# Patient Record
Sex: Male | Born: 1940 | Race: White | Hispanic: No | Marital: Single | Smoking: Former smoker
Health system: Southern US, Community
[De-identification: ages and names within clinical notes are randomized; demographics above are authoritative.]

## PROBLEM LIST (undated history)

## (undated) DIAGNOSIS — M1990 Unspecified osteoarthritis, unspecified site: Secondary | ICD-10-CM

## (undated) DIAGNOSIS — K219 Gastro-esophageal reflux disease without esophagitis: Secondary | ICD-10-CM

## (undated) DIAGNOSIS — Z8489 Family history of other specified conditions: Secondary | ICD-10-CM

## (undated) DIAGNOSIS — C801 Malignant (primary) neoplasm, unspecified: Secondary | ICD-10-CM

## (undated) DIAGNOSIS — I10 Essential (primary) hypertension: Secondary | ICD-10-CM

## (undated) HISTORY — PX: HERNIA REPAIR: SHX51

## (undated) HISTORY — PX: BACK SURGERY: SHX140

## (undated) HISTORY — PX: TONSILLECTOMY: SUR1361

## (undated) HISTORY — PX: PROSTATE SURGERY: SHX751

---

## 2005-01-18 ENCOUNTER — Ambulatory Visit: Payer: Self-pay | Admitting: Family Medicine

## 2005-03-10 ENCOUNTER — Ambulatory Visit: Payer: Self-pay | Admitting: Family Medicine

## 2010-03-03 ENCOUNTER — Ambulatory Visit
Admission: RE | Admit: 2010-03-03 | Discharge: 2010-03-17 | Payer: Self-pay | Source: Home / Self Care | Attending: Radiation Oncology | Admitting: Radiation Oncology

## 2010-03-30 ENCOUNTER — Ambulatory Visit
Admission: RE | Admit: 2010-03-30 | Discharge: 2010-04-20 | Payer: Self-pay | Source: Home / Self Care | Attending: Radiation Oncology | Admitting: Radiation Oncology

## 2010-04-06 ENCOUNTER — Encounter
Admission: RE | Admit: 2010-04-06 | Discharge: 2010-04-06 | Payer: Self-pay | Source: Home / Self Care | Attending: Urology | Admitting: Urology

## 2010-04-21 ENCOUNTER — Ambulatory Visit: Payer: Self-pay | Admitting: Radiation Oncology

## 2010-05-12 ENCOUNTER — Ambulatory Visit (HOSPITAL_COMMUNITY): Payer: Medicare Other | Attending: Urology

## 2010-05-12 ENCOUNTER — Ambulatory Visit (HOSPITAL_BASED_OUTPATIENT_CLINIC_OR_DEPARTMENT_OTHER)
Admission: RE | Admit: 2010-05-12 | Discharge: 2010-05-12 | Disposition: A | Discharge status: Home / Self Care | Payer: Medicare Other | Attending: Urology | Admitting: Urology

## 2010-05-12 DIAGNOSIS — Z79899 Other long term (current) drug therapy: Secondary | ICD-10-CM | POA: Insufficient documentation

## 2010-05-12 DIAGNOSIS — C61 Malignant neoplasm of prostate: Secondary | ICD-10-CM | POA: Insufficient documentation

## 2010-05-12 DIAGNOSIS — Z7982 Long term (current) use of aspirin: Secondary | ICD-10-CM | POA: Insufficient documentation

## 2010-05-12 DIAGNOSIS — Z87442 Personal history of urinary calculi: Secondary | ICD-10-CM | POA: Insufficient documentation

## 2010-05-12 DIAGNOSIS — K219 Gastro-esophageal reflux disease without esophagitis: Secondary | ICD-10-CM | POA: Insufficient documentation

## 2010-05-12 DIAGNOSIS — I10 Essential (primary) hypertension: Secondary | ICD-10-CM | POA: Insufficient documentation

## 2010-05-12 DIAGNOSIS — Z01810 Encounter for preprocedural cardiovascular examination: Secondary | ICD-10-CM | POA: Insufficient documentation

## 2010-05-27 ENCOUNTER — Ambulatory Visit: Payer: Medicare Other | Attending: Radiation Oncology | Admitting: Radiation Oncology

## 2010-05-27 DIAGNOSIS — R310 Gross hematuria: Secondary | ICD-10-CM | POA: Insufficient documentation

## 2010-05-27 DIAGNOSIS — Y842 Radiological procedure and radiotherapy as the cause of abnormal reaction of the patient, or of later complication, without mention of misadventure at the time of the procedure: Secondary | ICD-10-CM | POA: Insufficient documentation

## 2010-05-27 DIAGNOSIS — C61 Malignant neoplasm of prostate: Secondary | ICD-10-CM | POA: Insufficient documentation

## 2010-05-27 DIAGNOSIS — R300 Dysuria: Secondary | ICD-10-CM | POA: Insufficient documentation

## 2010-05-27 DIAGNOSIS — N342 Other urethritis: Secondary | ICD-10-CM | POA: Insufficient documentation

## 2010-09-14 ENCOUNTER — Ambulatory Visit
Admission: RE | Admit: 2010-09-14 | Discharge: 2010-09-14 | Disposition: A | Discharge status: Home / Self Care | Payer: Medicare Other | Source: Ambulatory Visit | Attending: Radiation Oncology | Admitting: Radiation Oncology

## 2012-01-20 IMAGING — CR DG CHEST 2V
2 series · 2 of 2 positions shown · non-contrast
Comparison: None.

CLINICAL DATA: Preop.  Prostate cancer.

CHEST - 2 VIEW

[w chest pa]
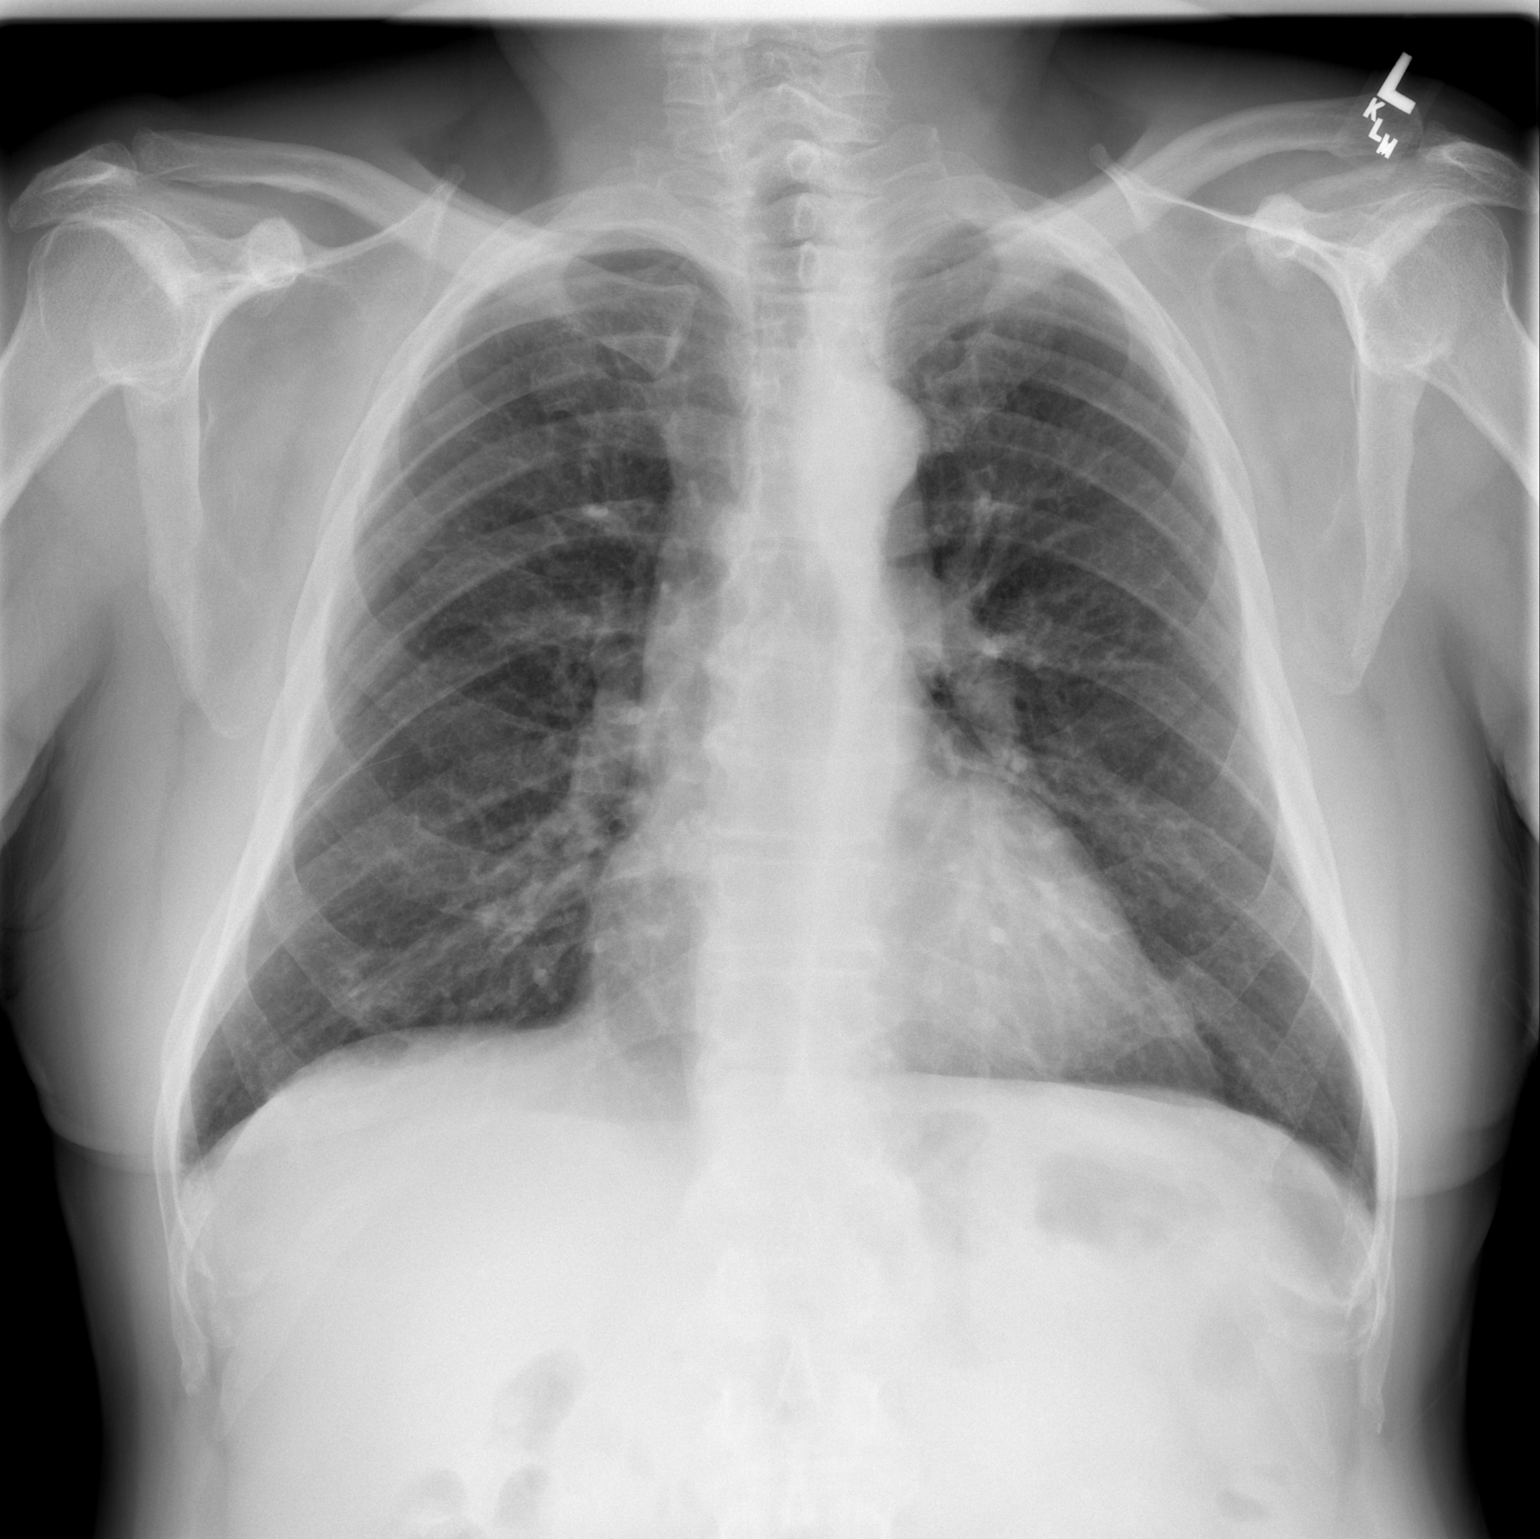

[w chest lat]
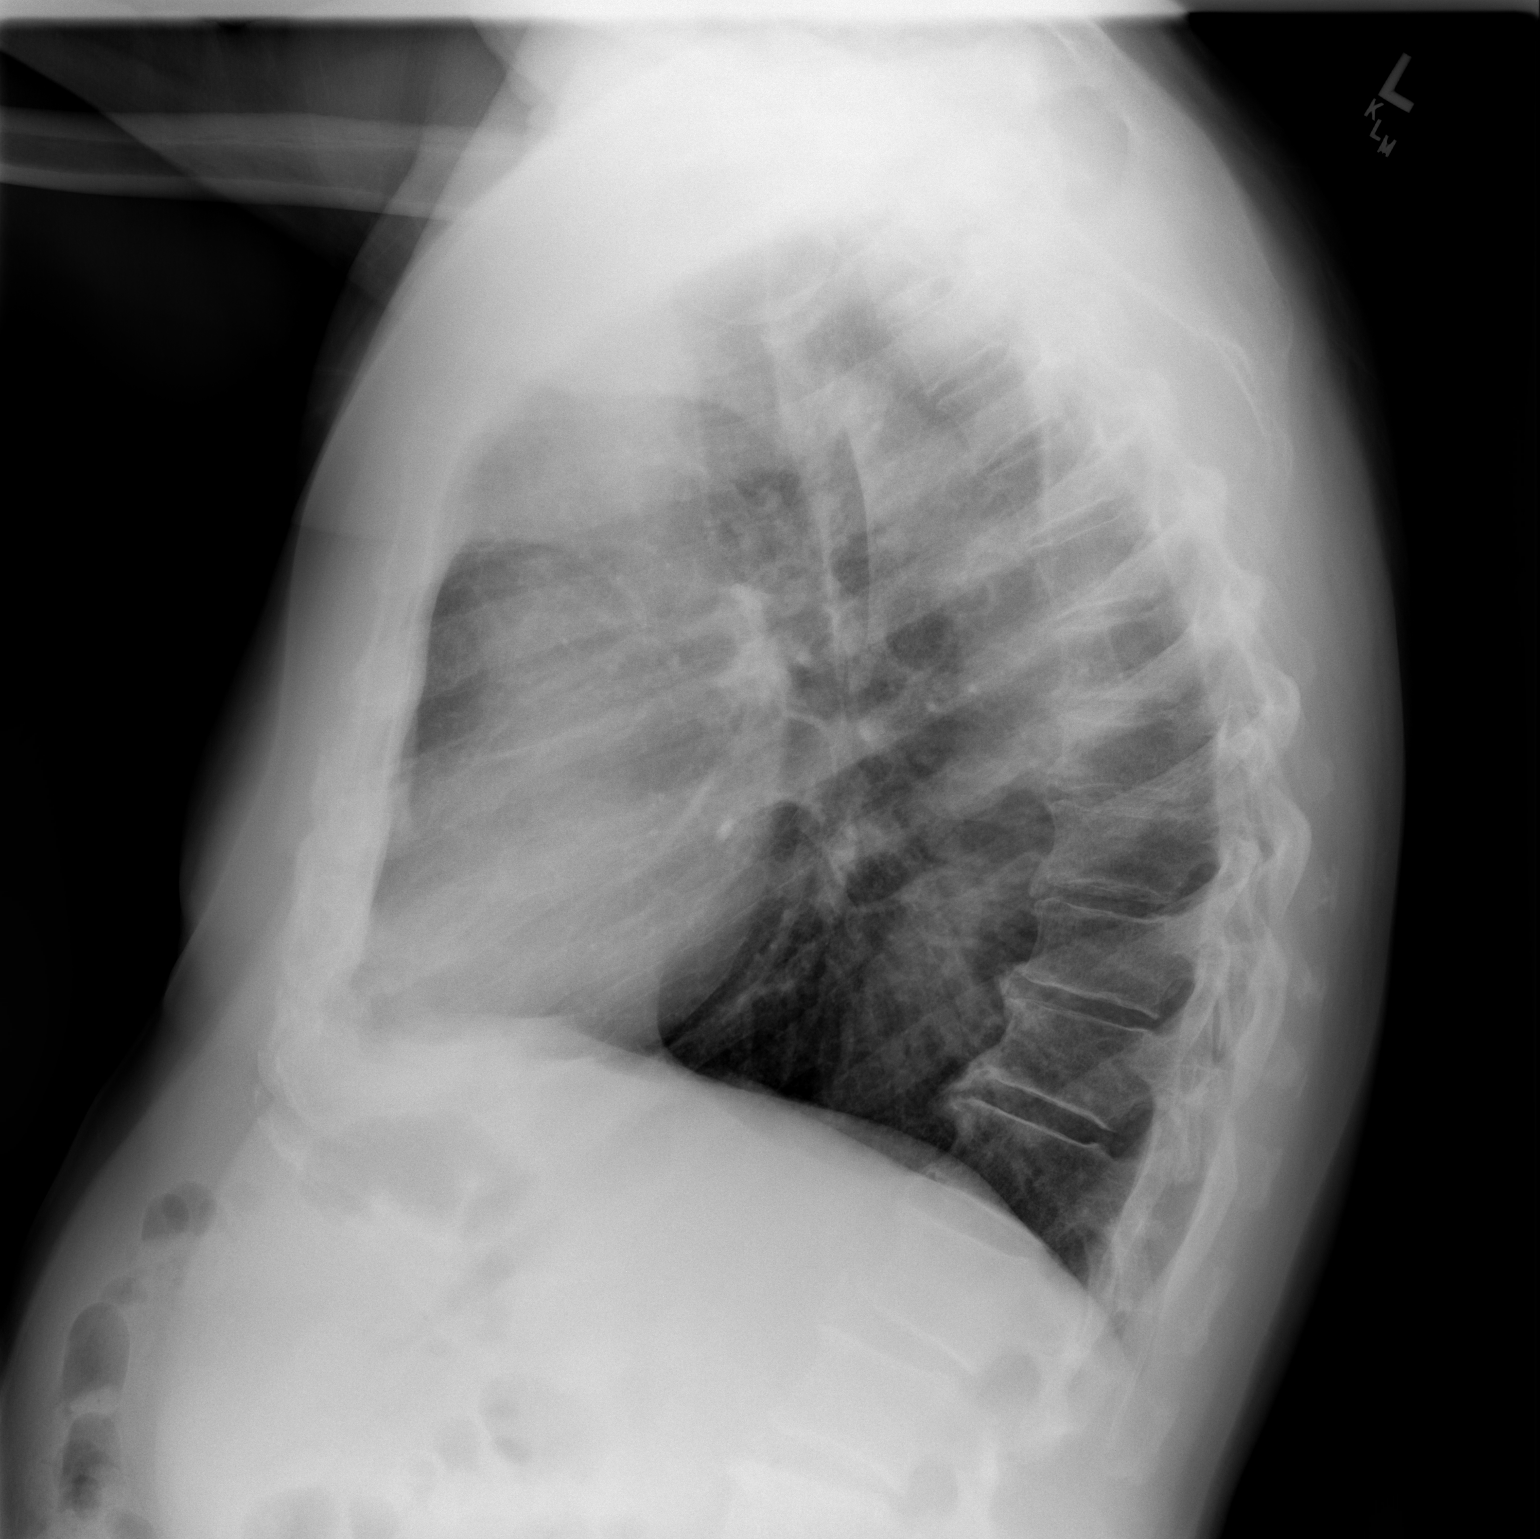

[2 of 2 positions shown; findings below may reference images not displayed]

FINDINGS: Normal heart size.  Clear lungs.  Increased AP diameter
of the chest is likely related to COPD.  Bronchitic changes have a
chronic appearance.  No pneumothorax.  No pleural effusion.
IMPRESSION: No active cardiopulmonary disease.

## 2015-05-06 DIAGNOSIS — H6982 Other specified disorders of Eustachian tube, left ear: Secondary | ICD-10-CM | POA: Insufficient documentation

## 2015-05-06 DIAGNOSIS — H9012 Conductive hearing loss, unilateral, left ear, with unrestricted hearing on the contralateral side: Secondary | ICD-10-CM | POA: Insufficient documentation

## 2016-08-30 DIAGNOSIS — I10 Essential (primary) hypertension: Secondary | ICD-10-CM | POA: Insufficient documentation

## 2016-08-30 DIAGNOSIS — Z01810 Encounter for preprocedural cardiovascular examination: Secondary | ICD-10-CM | POA: Insufficient documentation

## 2016-08-30 DIAGNOSIS — Z0000 Encounter for general adult medical examination without abnormal findings: Secondary | ICD-10-CM | POA: Insufficient documentation

## 2016-12-13 DIAGNOSIS — M0579 Rheumatoid arthritis with rheumatoid factor of multiple sites without organ or systems involvement: Secondary | ICD-10-CM | POA: Insufficient documentation

## 2016-12-13 DIAGNOSIS — C61 Malignant neoplasm of prostate: Secondary | ICD-10-CM | POA: Insufficient documentation

## 2017-04-11 DIAGNOSIS — H2513 Age-related nuclear cataract, bilateral: Secondary | ICD-10-CM | POA: Diagnosis not present

## 2017-04-11 DIAGNOSIS — H52223 Regular astigmatism, bilateral: Secondary | ICD-10-CM | POA: Diagnosis not present

## 2017-04-11 DIAGNOSIS — Z79899 Other long term (current) drug therapy: Secondary | ICD-10-CM | POA: Diagnosis not present

## 2017-04-28 DIAGNOSIS — M0579 Rheumatoid arthritis with rheumatoid factor of multiple sites without organ or systems involvement: Secondary | ICD-10-CM | POA: Diagnosis not present

## 2017-07-11 DIAGNOSIS — L578 Other skin changes due to chronic exposure to nonionizing radiation: Secondary | ICD-10-CM | POA: Diagnosis not present

## 2017-07-11 DIAGNOSIS — L570 Actinic keratosis: Secondary | ICD-10-CM | POA: Diagnosis not present

## 2017-07-11 DIAGNOSIS — R233 Spontaneous ecchymoses: Secondary | ICD-10-CM | POA: Diagnosis not present

## 2017-07-11 DIAGNOSIS — L821 Other seborrheic keratosis: Secondary | ICD-10-CM | POA: Diagnosis not present

## 2017-07-25 DIAGNOSIS — E669 Obesity, unspecified: Secondary | ICD-10-CM | POA: Diagnosis not present

## 2017-07-25 DIAGNOSIS — M150 Primary generalized (osteo)arthritis: Secondary | ICD-10-CM | POA: Diagnosis not present

## 2017-07-25 DIAGNOSIS — M0579 Rheumatoid arthritis with rheumatoid factor of multiple sites without organ or systems involvement: Secondary | ICD-10-CM | POA: Diagnosis not present

## 2017-07-25 DIAGNOSIS — Z79899 Other long term (current) drug therapy: Secondary | ICD-10-CM | POA: Diagnosis not present

## 2017-07-25 DIAGNOSIS — M5410 Radiculopathy, site unspecified: Secondary | ICD-10-CM | POA: Diagnosis not present

## 2017-07-25 DIAGNOSIS — K210 Gastro-esophageal reflux disease with esophagitis: Secondary | ICD-10-CM | POA: Diagnosis not present

## 2017-07-25 DIAGNOSIS — Z6830 Body mass index (BMI) 30.0-30.9, adult: Secondary | ICD-10-CM | POA: Diagnosis not present

## 2017-08-08 DIAGNOSIS — C61 Malignant neoplasm of prostate: Secondary | ICD-10-CM | POA: Diagnosis not present

## 2017-08-08 DIAGNOSIS — N3289 Other specified disorders of bladder: Secondary | ICD-10-CM | POA: Diagnosis not present

## 2017-08-28 DIAGNOSIS — S61412A Laceration without foreign body of left hand, initial encounter: Secondary | ICD-10-CM | POA: Diagnosis not present

## 2017-10-27 DIAGNOSIS — M150 Primary generalized (osteo)arthritis: Secondary | ICD-10-CM | POA: Diagnosis not present

## 2017-10-27 DIAGNOSIS — M5410 Radiculopathy, site unspecified: Secondary | ICD-10-CM | POA: Diagnosis not present

## 2017-10-27 DIAGNOSIS — M0579 Rheumatoid arthritis with rheumatoid factor of multiple sites without organ or systems involvement: Secondary | ICD-10-CM | POA: Diagnosis not present

## 2017-10-27 DIAGNOSIS — Z79899 Other long term (current) drug therapy: Secondary | ICD-10-CM | POA: Diagnosis not present

## 2017-10-27 DIAGNOSIS — Z6829 Body mass index (BMI) 29.0-29.9, adult: Secondary | ICD-10-CM | POA: Diagnosis not present

## 2017-10-27 DIAGNOSIS — E669 Obesity, unspecified: Secondary | ICD-10-CM | POA: Diagnosis not present

## 2017-10-27 DIAGNOSIS — K210 Gastro-esophageal reflux disease with esophagitis: Secondary | ICD-10-CM | POA: Diagnosis not present

## 2018-01-23 DIAGNOSIS — L570 Actinic keratosis: Secondary | ICD-10-CM | POA: Diagnosis not present

## 2018-01-23 DIAGNOSIS — L578 Other skin changes due to chronic exposure to nonionizing radiation: Secondary | ICD-10-CM | POA: Diagnosis not present

## 2018-01-23 DIAGNOSIS — L821 Other seborrheic keratosis: Secondary | ICD-10-CM | POA: Diagnosis not present

## 2018-01-30 DIAGNOSIS — M150 Primary generalized (osteo)arthritis: Secondary | ICD-10-CM | POA: Diagnosis not present

## 2018-01-30 DIAGNOSIS — Z6830 Body mass index (BMI) 30.0-30.9, adult: Secondary | ICD-10-CM | POA: Diagnosis not present

## 2018-01-30 DIAGNOSIS — M5410 Radiculopathy, site unspecified: Secondary | ICD-10-CM | POA: Diagnosis not present

## 2018-01-30 DIAGNOSIS — K210 Gastro-esophageal reflux disease with esophagitis: Secondary | ICD-10-CM | POA: Diagnosis not present

## 2018-01-30 DIAGNOSIS — Z79899 Other long term (current) drug therapy: Secondary | ICD-10-CM | POA: Diagnosis not present

## 2018-01-30 DIAGNOSIS — M0579 Rheumatoid arthritis with rheumatoid factor of multiple sites without organ or systems involvement: Secondary | ICD-10-CM | POA: Diagnosis not present

## 2018-01-30 DIAGNOSIS — E669 Obesity, unspecified: Secondary | ICD-10-CM | POA: Diagnosis not present

## 2018-02-13 DIAGNOSIS — N3289 Other specified disorders of bladder: Secondary | ICD-10-CM | POA: Diagnosis not present

## 2018-02-13 DIAGNOSIS — C61 Malignant neoplasm of prostate: Secondary | ICD-10-CM | POA: Diagnosis not present

## 2018-02-27 DIAGNOSIS — H6123 Impacted cerumen, bilateral: Secondary | ICD-10-CM | POA: Diagnosis not present

## 2018-04-06 DIAGNOSIS — J00 Acute nasopharyngitis [common cold]: Secondary | ICD-10-CM | POA: Diagnosis not present

## 2018-04-06 DIAGNOSIS — R05 Cough: Secondary | ICD-10-CM | POA: Diagnosis not present

## 2018-04-13 DIAGNOSIS — Z79899 Other long term (current) drug therapy: Secondary | ICD-10-CM | POA: Diagnosis not present

## 2018-05-08 DIAGNOSIS — M5410 Radiculopathy, site unspecified: Secondary | ICD-10-CM | POA: Diagnosis not present

## 2018-05-08 DIAGNOSIS — Z6829 Body mass index (BMI) 29.0-29.9, adult: Secondary | ICD-10-CM | POA: Diagnosis not present

## 2018-05-08 DIAGNOSIS — Z79899 Other long term (current) drug therapy: Secondary | ICD-10-CM | POA: Diagnosis not present

## 2018-05-08 DIAGNOSIS — M5136 Other intervertebral disc degeneration, lumbar region: Secondary | ICD-10-CM | POA: Diagnosis not present

## 2018-05-08 DIAGNOSIS — M150 Primary generalized (osteo)arthritis: Secondary | ICD-10-CM | POA: Diagnosis not present

## 2018-05-08 DIAGNOSIS — M0579 Rheumatoid arthritis with rheumatoid factor of multiple sites without organ or systems involvement: Secondary | ICD-10-CM | POA: Diagnosis not present

## 2018-05-08 DIAGNOSIS — E663 Overweight: Secondary | ICD-10-CM | POA: Diagnosis not present

## 2018-05-08 DIAGNOSIS — K210 Gastro-esophageal reflux disease with esophagitis: Secondary | ICD-10-CM | POA: Diagnosis not present

## 2018-05-23 DIAGNOSIS — Z79899 Other long term (current) drug therapy: Secondary | ICD-10-CM | POA: Diagnosis not present

## 2018-06-26 DIAGNOSIS — J3089 Other allergic rhinitis: Secondary | ICD-10-CM | POA: Insufficient documentation

## 2018-06-26 DIAGNOSIS — E785 Hyperlipidemia, unspecified: Secondary | ICD-10-CM | POA: Diagnosis not present

## 2018-06-26 DIAGNOSIS — K210 Gastro-esophageal reflux disease with esophagitis: Secondary | ICD-10-CM | POA: Diagnosis not present

## 2018-06-26 DIAGNOSIS — M5416 Radiculopathy, lumbar region: Secondary | ICD-10-CM | POA: Insufficient documentation

## 2018-06-26 DIAGNOSIS — G4733 Obstructive sleep apnea (adult) (pediatric): Secondary | ICD-10-CM | POA: Diagnosis not present

## 2018-06-26 DIAGNOSIS — R0600 Dyspnea, unspecified: Secondary | ICD-10-CM | POA: Diagnosis not present

## 2018-06-26 DIAGNOSIS — M0579 Rheumatoid arthritis with rheumatoid factor of multiple sites without organ or systems involvement: Secondary | ICD-10-CM | POA: Diagnosis not present

## 2018-06-26 DIAGNOSIS — M109 Gout, unspecified: Secondary | ICD-10-CM | POA: Diagnosis not present

## 2018-06-26 DIAGNOSIS — L570 Actinic keratosis: Secondary | ICD-10-CM | POA: Insufficient documentation

## 2018-06-26 DIAGNOSIS — Z13220 Encounter for screening for lipoid disorders: Secondary | ICD-10-CM | POA: Diagnosis not present

## 2018-06-26 DIAGNOSIS — M545 Low back pain: Secondary | ICD-10-CM | POA: Diagnosis not present

## 2018-06-26 DIAGNOSIS — I10 Essential (primary) hypertension: Secondary | ICD-10-CM | POA: Diagnosis not present

## 2018-06-26 DIAGNOSIS — E118 Type 2 diabetes mellitus with unspecified complications: Secondary | ICD-10-CM | POA: Diagnosis not present

## 2018-06-26 DIAGNOSIS — N138 Other obstructive and reflux uropathy: Secondary | ICD-10-CM | POA: Insufficient documentation

## 2018-06-26 DIAGNOSIS — E1169 Type 2 diabetes mellitus with other specified complication: Secondary | ICD-10-CM | POA: Diagnosis not present

## 2018-06-26 DIAGNOSIS — Z1211 Encounter for screening for malignant neoplasm of colon: Secondary | ICD-10-CM | POA: Diagnosis not present

## 2018-06-26 DIAGNOSIS — N401 Enlarged prostate with lower urinary tract symptoms: Secondary | ICD-10-CM | POA: Diagnosis not present

## 2018-06-26 DIAGNOSIS — Z0000 Encounter for general adult medical examination without abnormal findings: Secondary | ICD-10-CM | POA: Diagnosis not present

## 2018-07-10 DIAGNOSIS — L821 Other seborrheic keratosis: Secondary | ICD-10-CM | POA: Diagnosis not present

## 2018-07-10 DIAGNOSIS — L578 Other skin changes due to chronic exposure to nonionizing radiation: Secondary | ICD-10-CM | POA: Diagnosis not present

## 2018-07-10 DIAGNOSIS — L570 Actinic keratosis: Secondary | ICD-10-CM | POA: Diagnosis not present

## 2018-07-10 DIAGNOSIS — D0439 Carcinoma in situ of skin of other parts of face: Secondary | ICD-10-CM | POA: Diagnosis not present

## 2018-07-10 DIAGNOSIS — L820 Inflamed seborrheic keratosis: Secondary | ICD-10-CM | POA: Diagnosis not present

## 2018-08-07 DIAGNOSIS — M0579 Rheumatoid arthritis with rheumatoid factor of multiple sites without organ or systems involvement: Secondary | ICD-10-CM | POA: Diagnosis not present

## 2018-08-07 DIAGNOSIS — Z79899 Other long term (current) drug therapy: Secondary | ICD-10-CM | POA: Diagnosis not present

## 2018-08-21 DIAGNOSIS — N200 Calculus of kidney: Secondary | ICD-10-CM | POA: Diagnosis not present

## 2018-08-21 DIAGNOSIS — C61 Malignant neoplasm of prostate: Secondary | ICD-10-CM | POA: Diagnosis not present

## 2018-08-24 DIAGNOSIS — N200 Calculus of kidney: Secondary | ICD-10-CM | POA: Diagnosis not present

## 2018-09-28 DIAGNOSIS — L570 Actinic keratosis: Secondary | ICD-10-CM | POA: Diagnosis not present

## 2018-11-06 DIAGNOSIS — Z6829 Body mass index (BMI) 29.0-29.9, adult: Secondary | ICD-10-CM | POA: Diagnosis not present

## 2018-11-06 DIAGNOSIS — M5136 Other intervertebral disc degeneration, lumbar region: Secondary | ICD-10-CM | POA: Diagnosis not present

## 2018-11-06 DIAGNOSIS — M150 Primary generalized (osteo)arthritis: Secondary | ICD-10-CM | POA: Diagnosis not present

## 2018-11-06 DIAGNOSIS — K210 Gastro-esophageal reflux disease with esophagitis: Secondary | ICD-10-CM | POA: Diagnosis not present

## 2018-11-06 DIAGNOSIS — E663 Overweight: Secondary | ICD-10-CM | POA: Diagnosis not present

## 2018-11-06 DIAGNOSIS — Z79899 Other long term (current) drug therapy: Secondary | ICD-10-CM | POA: Diagnosis not present

## 2018-11-06 DIAGNOSIS — M0579 Rheumatoid arthritis with rheumatoid factor of multiple sites without organ or systems involvement: Secondary | ICD-10-CM | POA: Diagnosis not present

## 2018-11-06 DIAGNOSIS — M5410 Radiculopathy, site unspecified: Secondary | ICD-10-CM | POA: Diagnosis not present

## 2018-11-13 DIAGNOSIS — L570 Actinic keratosis: Secondary | ICD-10-CM | POA: Diagnosis not present

## 2018-11-13 DIAGNOSIS — C4442 Squamous cell carcinoma of skin of scalp and neck: Secondary | ICD-10-CM | POA: Diagnosis not present

## 2018-11-13 DIAGNOSIS — L578 Other skin changes due to chronic exposure to nonionizing radiation: Secondary | ICD-10-CM | POA: Diagnosis not present

## 2018-12-04 DIAGNOSIS — C4441 Basal cell carcinoma of skin of scalp and neck: Secondary | ICD-10-CM | POA: Diagnosis not present

## 2018-12-14 DIAGNOSIS — M1611 Unilateral primary osteoarthritis, right hip: Secondary | ICD-10-CM | POA: Diagnosis not present

## 2018-12-14 DIAGNOSIS — M25559 Pain in unspecified hip: Secondary | ICD-10-CM | POA: Diagnosis not present

## 2018-12-14 DIAGNOSIS — M25561 Pain in right knee: Secondary | ICD-10-CM | POA: Diagnosis not present

## 2018-12-16 DIAGNOSIS — M1611 Unilateral primary osteoarthritis, right hip: Secondary | ICD-10-CM | POA: Insufficient documentation

## 2018-12-16 DIAGNOSIS — M25561 Pain in right knee: Secondary | ICD-10-CM | POA: Insufficient documentation

## 2018-12-26 DIAGNOSIS — M25551 Pain in right hip: Secondary | ICD-10-CM | POA: Diagnosis not present

## 2019-01-03 DIAGNOSIS — Z20828 Contact with and (suspected) exposure to other viral communicable diseases: Secondary | ICD-10-CM | POA: Diagnosis not present

## 2019-01-03 DIAGNOSIS — J029 Acute pharyngitis, unspecified: Secondary | ICD-10-CM | POA: Diagnosis not present

## 2019-01-03 DIAGNOSIS — Z87891 Personal history of nicotine dependence: Secondary | ICD-10-CM | POA: Diagnosis not present

## 2019-01-03 DIAGNOSIS — R0982 Postnasal drip: Secondary | ICD-10-CM | POA: Diagnosis not present

## 2019-02-05 DIAGNOSIS — M150 Primary generalized (osteo)arthritis: Secondary | ICD-10-CM | POA: Diagnosis not present

## 2019-02-05 DIAGNOSIS — M5136 Other intervertebral disc degeneration, lumbar region: Secondary | ICD-10-CM | POA: Diagnosis not present

## 2019-02-05 DIAGNOSIS — M5410 Radiculopathy, site unspecified: Secondary | ICD-10-CM | POA: Diagnosis not present

## 2019-02-05 DIAGNOSIS — Z6829 Body mass index (BMI) 29.0-29.9, adult: Secondary | ICD-10-CM | POA: Diagnosis not present

## 2019-02-05 DIAGNOSIS — M0579 Rheumatoid arthritis with rheumatoid factor of multiple sites without organ or systems involvement: Secondary | ICD-10-CM | POA: Diagnosis not present

## 2019-02-05 DIAGNOSIS — E663 Overweight: Secondary | ICD-10-CM | POA: Diagnosis not present

## 2019-02-05 DIAGNOSIS — K2100 Gastro-esophageal reflux disease with esophagitis, without bleeding: Secondary | ICD-10-CM | POA: Diagnosis not present

## 2019-02-05 DIAGNOSIS — Z79899 Other long term (current) drug therapy: Secondary | ICD-10-CM | POA: Diagnosis not present

## 2019-02-05 DIAGNOSIS — M25551 Pain in right hip: Secondary | ICD-10-CM | POA: Diagnosis not present

## 2019-02-19 DIAGNOSIS — C61 Malignant neoplasm of prostate: Secondary | ICD-10-CM | POA: Diagnosis not present

## 2019-02-19 DIAGNOSIS — N200 Calculus of kidney: Secondary | ICD-10-CM | POA: Diagnosis not present

## 2019-02-19 DIAGNOSIS — N3289 Other specified disorders of bladder: Secondary | ICD-10-CM | POA: Diagnosis not present

## 2019-04-26 DIAGNOSIS — Z79899 Other long term (current) drug therapy: Secondary | ICD-10-CM | POA: Diagnosis not present

## 2019-04-26 DIAGNOSIS — H25013 Cortical age-related cataract, bilateral: Secondary | ICD-10-CM | POA: Diagnosis not present

## 2019-05-14 DIAGNOSIS — M150 Primary generalized (osteo)arthritis: Secondary | ICD-10-CM | POA: Diagnosis not present

## 2019-05-14 DIAGNOSIS — E663 Overweight: Secondary | ICD-10-CM | POA: Diagnosis not present

## 2019-05-14 DIAGNOSIS — K2100 Gastro-esophageal reflux disease with esophagitis, without bleeding: Secondary | ICD-10-CM | POA: Diagnosis not present

## 2019-05-14 DIAGNOSIS — Z79899 Other long term (current) drug therapy: Secondary | ICD-10-CM | POA: Diagnosis not present

## 2019-05-14 DIAGNOSIS — M25551 Pain in right hip: Secondary | ICD-10-CM | POA: Diagnosis not present

## 2019-05-14 DIAGNOSIS — M5410 Radiculopathy, site unspecified: Secondary | ICD-10-CM | POA: Diagnosis not present

## 2019-05-14 DIAGNOSIS — M5136 Other intervertebral disc degeneration, lumbar region: Secondary | ICD-10-CM | POA: Diagnosis not present

## 2019-05-14 DIAGNOSIS — Z6829 Body mass index (BMI) 29.0-29.9, adult: Secondary | ICD-10-CM | POA: Diagnosis not present

## 2019-05-14 DIAGNOSIS — M0579 Rheumatoid arthritis with rheumatoid factor of multiple sites without organ or systems involvement: Secondary | ICD-10-CM | POA: Diagnosis not present

## 2019-05-17 DIAGNOSIS — L570 Actinic keratosis: Secondary | ICD-10-CM | POA: Diagnosis not present

## 2019-05-17 DIAGNOSIS — L578 Other skin changes due to chronic exposure to nonionizing radiation: Secondary | ICD-10-CM | POA: Diagnosis not present

## 2019-05-17 DIAGNOSIS — L821 Other seborrheic keratosis: Secondary | ICD-10-CM | POA: Diagnosis not present

## 2019-05-17 DIAGNOSIS — C4441 Basal cell carcinoma of skin of scalp and neck: Secondary | ICD-10-CM | POA: Diagnosis not present

## 2019-06-14 DIAGNOSIS — Z79899 Other long term (current) drug therapy: Secondary | ICD-10-CM | POA: Diagnosis not present

## 2019-07-12 DIAGNOSIS — N138 Other obstructive and reflux uropathy: Secondary | ICD-10-CM | POA: Diagnosis not present

## 2019-07-12 DIAGNOSIS — Z1211 Encounter for screening for malignant neoplasm of colon: Secondary | ICD-10-CM | POA: Diagnosis not present

## 2019-07-12 DIAGNOSIS — Z13220 Encounter for screening for lipoid disorders: Secondary | ICD-10-CM | POA: Diagnosis not present

## 2019-07-12 DIAGNOSIS — I10 Essential (primary) hypertension: Secondary | ICD-10-CM | POA: Diagnosis not present

## 2019-07-12 DIAGNOSIS — N401 Enlarged prostate with lower urinary tract symptoms: Secondary | ICD-10-CM | POA: Diagnosis not present

## 2019-07-12 DIAGNOSIS — Z0000 Encounter for general adult medical examination without abnormal findings: Secondary | ICD-10-CM | POA: Diagnosis not present

## 2019-07-12 DIAGNOSIS — K2100 Gastro-esophageal reflux disease with esophagitis, without bleeding: Secondary | ICD-10-CM | POA: Diagnosis not present

## 2019-07-16 DIAGNOSIS — M545 Low back pain: Secondary | ICD-10-CM | POA: Diagnosis not present

## 2019-07-26 DIAGNOSIS — M48062 Spinal stenosis, lumbar region with neurogenic claudication: Secondary | ICD-10-CM | POA: Diagnosis not present

## 2019-07-26 DIAGNOSIS — M545 Low back pain: Secondary | ICD-10-CM | POA: Diagnosis not present

## 2019-07-26 DIAGNOSIS — M4726 Other spondylosis with radiculopathy, lumbar region: Secondary | ICD-10-CM | POA: Diagnosis not present

## 2019-08-01 DIAGNOSIS — M545 Low back pain: Secondary | ICD-10-CM | POA: Diagnosis not present

## 2019-08-09 DIAGNOSIS — M48062 Spinal stenosis, lumbar region with neurogenic claudication: Secondary | ICD-10-CM | POA: Diagnosis not present

## 2019-08-13 DIAGNOSIS — Z79899 Other long term (current) drug therapy: Secondary | ICD-10-CM | POA: Diagnosis not present

## 2019-08-13 DIAGNOSIS — M0579 Rheumatoid arthritis with rheumatoid factor of multiple sites without organ or systems involvement: Secondary | ICD-10-CM | POA: Diagnosis not present

## 2019-08-13 DIAGNOSIS — M150 Primary generalized (osteo)arthritis: Secondary | ICD-10-CM | POA: Diagnosis not present

## 2019-08-13 DIAGNOSIS — K2100 Gastro-esophageal reflux disease with esophagitis, without bleeding: Secondary | ICD-10-CM | POA: Diagnosis not present

## 2019-08-13 DIAGNOSIS — E663 Overweight: Secondary | ICD-10-CM | POA: Diagnosis not present

## 2019-08-13 DIAGNOSIS — M5410 Radiculopathy, site unspecified: Secondary | ICD-10-CM | POA: Diagnosis not present

## 2019-08-13 DIAGNOSIS — Z6828 Body mass index (BMI) 28.0-28.9, adult: Secondary | ICD-10-CM | POA: Diagnosis not present

## 2019-08-13 DIAGNOSIS — M25551 Pain in right hip: Secondary | ICD-10-CM | POA: Diagnosis not present

## 2019-08-13 DIAGNOSIS — M5136 Other intervertebral disc degeneration, lumbar region: Secondary | ICD-10-CM | POA: Diagnosis not present

## 2019-08-23 DIAGNOSIS — C61 Malignant neoplasm of prostate: Secondary | ICD-10-CM | POA: Diagnosis not present

## 2019-08-23 DIAGNOSIS — N3289 Other specified disorders of bladder: Secondary | ICD-10-CM | POA: Diagnosis not present

## 2019-09-17 DIAGNOSIS — L570 Actinic keratosis: Secondary | ICD-10-CM | POA: Diagnosis not present

## 2019-09-19 DIAGNOSIS — M4316 Spondylolisthesis, lumbar region: Secondary | ICD-10-CM | POA: Diagnosis not present

## 2019-09-19 DIAGNOSIS — M0579 Rheumatoid arthritis with rheumatoid factor of multiple sites without organ or systems involvement: Secondary | ICD-10-CM | POA: Diagnosis not present

## 2019-09-19 DIAGNOSIS — M48062 Spinal stenosis, lumbar region with neurogenic claudication: Secondary | ICD-10-CM | POA: Diagnosis not present

## 2019-09-19 DIAGNOSIS — M47816 Spondylosis without myelopathy or radiculopathy, lumbar region: Secondary | ICD-10-CM | POA: Diagnosis not present

## 2019-09-20 DIAGNOSIS — M47816 Spondylosis without myelopathy or radiculopathy, lumbar region: Secondary | ICD-10-CM | POA: Insufficient documentation

## 2019-10-09 DIAGNOSIS — M5416 Radiculopathy, lumbar region: Secondary | ICD-10-CM | POA: Diagnosis not present

## 2019-10-09 DIAGNOSIS — Z01811 Encounter for preprocedural respiratory examination: Secondary | ICD-10-CM | POA: Diagnosis not present

## 2019-10-09 DIAGNOSIS — I708 Atherosclerosis of other arteries: Secondary | ICD-10-CM | POA: Diagnosis not present

## 2019-10-09 DIAGNOSIS — M5117 Intervertebral disc disorders with radiculopathy, lumbosacral region: Secondary | ICD-10-CM | POA: Diagnosis not present

## 2019-10-09 DIAGNOSIS — Z01812 Encounter for preprocedural laboratory examination: Secondary | ICD-10-CM | POA: Diagnosis not present

## 2019-10-09 DIAGNOSIS — M4726 Other spondylosis with radiculopathy, lumbar region: Secondary | ICD-10-CM | POA: Diagnosis not present

## 2019-10-09 DIAGNOSIS — Z01810 Encounter for preprocedural cardiovascular examination: Secondary | ICD-10-CM | POA: Diagnosis not present

## 2019-10-09 DIAGNOSIS — M4316 Spondylolisthesis, lumbar region: Secondary | ICD-10-CM | POA: Diagnosis not present

## 2019-10-09 DIAGNOSIS — Z792 Long term (current) use of antibiotics: Secondary | ICD-10-CM | POA: Diagnosis not present

## 2019-10-09 DIAGNOSIS — M48062 Spinal stenosis, lumbar region with neurogenic claudication: Secondary | ICD-10-CM | POA: Diagnosis not present

## 2019-10-24 DIAGNOSIS — L309 Dermatitis, unspecified: Secondary | ICD-10-CM | POA: Diagnosis not present

## 2019-10-29 DIAGNOSIS — Z87891 Personal history of nicotine dependence: Secondary | ICD-10-CM | POA: Diagnosis not present

## 2019-10-29 DIAGNOSIS — Z7982 Long term (current) use of aspirin: Secondary | ICD-10-CM | POA: Diagnosis not present

## 2019-10-29 DIAGNOSIS — M4316 Spondylolisthesis, lumbar region: Secondary | ICD-10-CM | POA: Diagnosis not present

## 2019-10-29 DIAGNOSIS — Z01818 Encounter for other preprocedural examination: Secondary | ICD-10-CM | POA: Diagnosis not present

## 2019-10-29 DIAGNOSIS — M5416 Radiculopathy, lumbar region: Secondary | ICD-10-CM | POA: Diagnosis not present

## 2019-10-29 DIAGNOSIS — M48062 Spinal stenosis, lumbar region with neurogenic claudication: Secondary | ICD-10-CM | POA: Diagnosis not present

## 2019-10-29 DIAGNOSIS — M545 Low back pain: Secondary | ICD-10-CM | POA: Diagnosis not present

## 2019-10-29 DIAGNOSIS — I498 Other specified cardiac arrhythmias: Secondary | ICD-10-CM | POA: Diagnosis not present

## 2019-11-28 DIAGNOSIS — Z23 Encounter for immunization: Secondary | ICD-10-CM | POA: Diagnosis not present

## 2019-12-23 DIAGNOSIS — Z6828 Body mass index (BMI) 28.0-28.9, adult: Secondary | ICD-10-CM | POA: Diagnosis not present

## 2019-12-23 DIAGNOSIS — E663 Overweight: Secondary | ICD-10-CM | POA: Diagnosis not present

## 2019-12-23 DIAGNOSIS — M0579 Rheumatoid arthritis with rheumatoid factor of multiple sites without organ or systems involvement: Secondary | ICD-10-CM | POA: Diagnosis not present

## 2019-12-23 DIAGNOSIS — M5410 Radiculopathy, site unspecified: Secondary | ICD-10-CM | POA: Diagnosis not present

## 2019-12-23 DIAGNOSIS — Z79899 Other long term (current) drug therapy: Secondary | ICD-10-CM | POA: Diagnosis not present

## 2019-12-23 DIAGNOSIS — K2100 Gastro-esophageal reflux disease with esophagitis, without bleeding: Secondary | ICD-10-CM | POA: Diagnosis not present

## 2019-12-23 DIAGNOSIS — M150 Primary generalized (osteo)arthritis: Secondary | ICD-10-CM | POA: Diagnosis not present

## 2019-12-23 DIAGNOSIS — M25551 Pain in right hip: Secondary | ICD-10-CM | POA: Diagnosis not present

## 2019-12-23 DIAGNOSIS — M5136 Other intervertebral disc degeneration, lumbar region: Secondary | ICD-10-CM | POA: Diagnosis not present

## 2019-12-24 DIAGNOSIS — L570 Actinic keratosis: Secondary | ICD-10-CM | POA: Diagnosis not present

## 2020-01-22 DIAGNOSIS — M48062 Spinal stenosis, lumbar region with neurogenic claudication: Secondary | ICD-10-CM | POA: Diagnosis not present

## 2020-01-22 DIAGNOSIS — Z01818 Encounter for other preprocedural examination: Secondary | ICD-10-CM | POA: Diagnosis not present

## 2020-02-04 DIAGNOSIS — E876 Hypokalemia: Secondary | ICD-10-CM | POA: Diagnosis not present

## 2020-02-04 DIAGNOSIS — N138 Other obstructive and reflux uropathy: Secondary | ICD-10-CM | POA: Diagnosis not present

## 2020-02-04 DIAGNOSIS — Z20822 Contact with and (suspected) exposure to covid-19: Secondary | ICD-10-CM | POA: Diagnosis not present

## 2020-02-04 DIAGNOSIS — Z8546 Personal history of malignant neoplasm of prostate: Secondary | ICD-10-CM | POA: Diagnosis not present

## 2020-02-04 DIAGNOSIS — L570 Actinic keratosis: Secondary | ICD-10-CM | POA: Diagnosis not present

## 2020-02-04 DIAGNOSIS — M1611 Unilateral primary osteoarthritis, right hip: Secondary | ICD-10-CM | POA: Diagnosis not present

## 2020-02-04 DIAGNOSIS — M25561 Pain in right knee: Secondary | ICD-10-CM | POA: Diagnosis not present

## 2020-02-04 DIAGNOSIS — M4807 Spinal stenosis, lumbosacral region: Secondary | ICD-10-CM | POA: Diagnosis not present

## 2020-02-04 DIAGNOSIS — M48061 Spinal stenosis, lumbar region without neurogenic claudication: Secondary | ICD-10-CM | POA: Diagnosis not present

## 2020-02-04 DIAGNOSIS — N401 Enlarged prostate with lower urinary tract symptoms: Secondary | ICD-10-CM | POA: Diagnosis not present

## 2020-02-04 DIAGNOSIS — H9072 Mixed conductive and sensorineural hearing loss, unilateral, left ear, with unrestricted hearing on the contralateral side: Secondary | ICD-10-CM | POA: Diagnosis not present

## 2020-02-04 DIAGNOSIS — K2100 Gastro-esophageal reflux disease with esophagitis, without bleeding: Secondary | ICD-10-CM | POA: Diagnosis not present

## 2020-02-04 DIAGNOSIS — M48062 Spinal stenosis, lumbar region with neurogenic claudication: Secondary | ICD-10-CM | POA: Diagnosis not present

## 2020-02-04 DIAGNOSIS — M5416 Radiculopathy, lumbar region: Secondary | ICD-10-CM | POA: Diagnosis not present

## 2020-02-04 DIAGNOSIS — I10 Essential (primary) hypertension: Secondary | ICD-10-CM | POA: Diagnosis not present

## 2020-02-04 DIAGNOSIS — K219 Gastro-esophageal reflux disease without esophagitis: Secondary | ICD-10-CM | POA: Diagnosis not present

## 2020-02-04 DIAGNOSIS — M0579 Rheumatoid arthritis with rheumatoid factor of multiple sites without organ or systems involvement: Secondary | ICD-10-CM | POA: Diagnosis not present

## 2020-02-08 DIAGNOSIS — H9072 Mixed conductive and sensorineural hearing loss, unilateral, left ear, with unrestricted hearing on the contralateral side: Secondary | ICD-10-CM | POA: Diagnosis not present

## 2020-02-08 DIAGNOSIS — M48062 Spinal stenosis, lumbar region with neurogenic claudication: Secondary | ICD-10-CM | POA: Diagnosis not present

## 2020-02-08 DIAGNOSIS — M25561 Pain in right knee: Secondary | ICD-10-CM | POA: Diagnosis not present

## 2020-02-08 DIAGNOSIS — K219 Gastro-esophageal reflux disease without esophagitis: Secondary | ICD-10-CM | POA: Diagnosis not present

## 2020-02-08 DIAGNOSIS — Z8546 Personal history of malignant neoplasm of prostate: Secondary | ICD-10-CM | POA: Diagnosis not present

## 2020-02-08 DIAGNOSIS — M5416 Radiculopathy, lumbar region: Secondary | ICD-10-CM | POA: Diagnosis not present

## 2020-02-08 DIAGNOSIS — L570 Actinic keratosis: Secondary | ICD-10-CM | POA: Diagnosis not present

## 2020-02-08 DIAGNOSIS — N401 Enlarged prostate with lower urinary tract symptoms: Secondary | ICD-10-CM | POA: Diagnosis not present

## 2020-02-08 DIAGNOSIS — I10 Essential (primary) hypertension: Secondary | ICD-10-CM | POA: Diagnosis not present

## 2020-02-08 DIAGNOSIS — M1611 Unilateral primary osteoarthritis, right hip: Secondary | ICD-10-CM | POA: Diagnosis not present

## 2020-02-17 DIAGNOSIS — Z9889 Other specified postprocedural states: Secondary | ICD-10-CM | POA: Insufficient documentation

## 2020-03-03 DIAGNOSIS — E876 Hypokalemia: Secondary | ICD-10-CM | POA: Diagnosis not present

## 2020-03-03 DIAGNOSIS — Z09 Encounter for follow-up examination after completed treatment for conditions other than malignant neoplasm: Secondary | ICD-10-CM | POA: Diagnosis not present

## 2020-03-31 DIAGNOSIS — L821 Other seborrheic keratosis: Secondary | ICD-10-CM | POA: Diagnosis not present

## 2020-03-31 DIAGNOSIS — L570 Actinic keratosis: Secondary | ICD-10-CM | POA: Diagnosis not present

## 2020-04-16 DIAGNOSIS — N3289 Other specified disorders of bladder: Secondary | ICD-10-CM | POA: Diagnosis not present

## 2020-04-16 DIAGNOSIS — C61 Malignant neoplasm of prostate: Secondary | ICD-10-CM | POA: Diagnosis not present

## 2020-06-23 DIAGNOSIS — Z79899 Other long term (current) drug therapy: Secondary | ICD-10-CM | POA: Diagnosis not present

## 2020-06-23 DIAGNOSIS — M79651 Pain in right thigh: Secondary | ICD-10-CM | POA: Diagnosis not present

## 2020-06-23 DIAGNOSIS — E663 Overweight: Secondary | ICD-10-CM | POA: Diagnosis not present

## 2020-06-23 DIAGNOSIS — M150 Primary generalized (osteo)arthritis: Secondary | ICD-10-CM | POA: Diagnosis not present

## 2020-06-23 DIAGNOSIS — M5136 Other intervertebral disc degeneration, lumbar region: Secondary | ICD-10-CM | POA: Diagnosis not present

## 2020-06-23 DIAGNOSIS — M0579 Rheumatoid arthritis with rheumatoid factor of multiple sites without organ or systems involvement: Secondary | ICD-10-CM | POA: Diagnosis not present

## 2020-06-23 DIAGNOSIS — Z6829 Body mass index (BMI) 29.0-29.9, adult: Secondary | ICD-10-CM | POA: Diagnosis not present

## 2020-06-23 DIAGNOSIS — M79652 Pain in left thigh: Secondary | ICD-10-CM | POA: Diagnosis not present

## 2020-06-30 DIAGNOSIS — L570 Actinic keratosis: Secondary | ICD-10-CM | POA: Diagnosis not present

## 2020-08-03 DIAGNOSIS — Z9889 Other specified postprocedural states: Secondary | ICD-10-CM | POA: Diagnosis not present

## 2020-09-09 DIAGNOSIS — Z79899 Other long term (current) drug therapy: Secondary | ICD-10-CM | POA: Diagnosis not present

## 2020-09-09 DIAGNOSIS — H25013 Cortical age-related cataract, bilateral: Secondary | ICD-10-CM | POA: Diagnosis not present

## 2020-10-02 DIAGNOSIS — L578 Other skin changes due to chronic exposure to nonionizing radiation: Secondary | ICD-10-CM | POA: Diagnosis not present

## 2020-10-02 DIAGNOSIS — L570 Actinic keratosis: Secondary | ICD-10-CM | POA: Diagnosis not present

## 2020-10-02 DIAGNOSIS — L821 Other seborrheic keratosis: Secondary | ICD-10-CM | POA: Diagnosis not present

## 2020-10-13 DIAGNOSIS — C61 Malignant neoplasm of prostate: Secondary | ICD-10-CM | POA: Diagnosis not present

## 2020-10-13 DIAGNOSIS — N3289 Other specified disorders of bladder: Secondary | ICD-10-CM | POA: Diagnosis not present

## 2020-11-09 DIAGNOSIS — Z9889 Other specified postprocedural states: Secondary | ICD-10-CM | POA: Diagnosis not present

## 2020-11-27 DIAGNOSIS — Z79899 Other long term (current) drug therapy: Secondary | ICD-10-CM | POA: Diagnosis not present

## 2020-12-29 DIAGNOSIS — Z6828 Body mass index (BMI) 28.0-28.9, adult: Secondary | ICD-10-CM | POA: Diagnosis not present

## 2020-12-29 DIAGNOSIS — M5136 Other intervertebral disc degeneration, lumbar region: Secondary | ICD-10-CM | POA: Diagnosis not present

## 2020-12-29 DIAGNOSIS — M25551 Pain in right hip: Secondary | ICD-10-CM | POA: Diagnosis not present

## 2020-12-29 DIAGNOSIS — M150 Primary generalized (osteo)arthritis: Secondary | ICD-10-CM | POA: Diagnosis not present

## 2020-12-29 DIAGNOSIS — M0579 Rheumatoid arthritis with rheumatoid factor of multiple sites without organ or systems involvement: Secondary | ICD-10-CM | POA: Diagnosis not present

## 2020-12-29 DIAGNOSIS — Z79899 Other long term (current) drug therapy: Secondary | ICD-10-CM | POA: Diagnosis not present

## 2020-12-29 DIAGNOSIS — E663 Overweight: Secondary | ICD-10-CM | POA: Diagnosis not present

## 2021-01-05 DIAGNOSIS — L570 Actinic keratosis: Secondary | ICD-10-CM | POA: Diagnosis not present

## 2021-02-02 DIAGNOSIS — M25551 Pain in right hip: Secondary | ICD-10-CM | POA: Diagnosis not present

## 2021-02-02 DIAGNOSIS — M4316 Spondylolisthesis, lumbar region: Secondary | ICD-10-CM | POA: Diagnosis not present

## 2021-02-08 DIAGNOSIS — M4316 Spondylolisthesis, lumbar region: Secondary | ICD-10-CM | POA: Diagnosis not present

## 2021-02-08 DIAGNOSIS — Z9889 Other specified postprocedural states: Secondary | ICD-10-CM | POA: Diagnosis not present

## 2021-03-02 ENCOUNTER — Encounter: Payer: Self-pay | Admitting: Sports Medicine

## 2021-03-02 ENCOUNTER — Other Ambulatory Visit: Payer: Self-pay

## 2021-03-02 ENCOUNTER — Ambulatory Visit (INDEPENDENT_AMBULATORY_CARE_PROVIDER_SITE_OTHER): Payer: PPO | Admitting: Sports Medicine

## 2021-03-02 DIAGNOSIS — K2100 Gastro-esophageal reflux disease with esophagitis, without bleeding: Secondary | ICD-10-CM | POA: Insufficient documentation

## 2021-03-02 DIAGNOSIS — M51369 Other intervertebral disc degeneration, lumbar region without mention of lumbar back pain or lower extremity pain: Secondary | ICD-10-CM | POA: Insufficient documentation

## 2021-03-02 DIAGNOSIS — L989 Disorder of the skin and subcutaneous tissue, unspecified: Secondary | ICD-10-CM | POA: Diagnosis not present

## 2021-03-02 DIAGNOSIS — B070 Plantar wart: Secondary | ICD-10-CM | POA: Diagnosis not present

## 2021-03-02 DIAGNOSIS — M79672 Pain in left foot: Secondary | ICD-10-CM | POA: Diagnosis not present

## 2021-03-02 DIAGNOSIS — M150 Primary generalized (osteo)arthritis: Secondary | ICD-10-CM | POA: Insufficient documentation

## 2021-03-02 DIAGNOSIS — M5136 Other intervertebral disc degeneration, lumbar region: Secondary | ICD-10-CM | POA: Insufficient documentation

## 2021-03-02 NOTE — Progress Notes (Signed)
Subjective: Frank Zuniga is a 80 y.o. male patient who presents to office for evaluation of left foot pain secondary to moderately painful wart at the ball of the foot that has been present for over 20+ years. Patient has tried self trimming with no relief in symptoms. Patient denies any other pedal complaints.   Admits to a history of rheumatoid arthritis on Plaquenil.  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     aspirin 81 MG EC tablet Take by mouth.     calcium carbonate (SUPER CALCIUM) 1500 (600 Ca) MG TABS tablet 1 tablet with meals     Cholecalciferol 25 MCG (1000 UT) tablet Take by mouth.     esomeprazole (NEXIUM) 40 MG capsule 1 capsule     fluticasone (FLONASE) 50 MCG/ACT nasal spray Place into the nose.     folic acid (FOLVITE) 1 MG tablet TAKE ONE TABLET BY MOUTH EVERY DAY 30      hydroxychloroquine (PLAQUENIL) 200 MG tablet 1 tablet with food or milk     Krill Oil 500 MG CAPS See admin instructions.     methotrexate 50 MG/2ML injection INJECT 1 MILLILITER SUBCUTANEOUSLY EVERY WEEK.     Multiple Vitamins-Minerals (CENTRUM SILVER 50+MEN) TABS Take by mouth.     Olmesartan-amLODIPine-HCTZ (TRIBENZOR) 40-5-12.5 MG TABS 1 tablet     polyethylene glycol powder (GLYCOLAX/MIRALAX) 17 GM/SCOOP powder Take by mouth.     predniSONE (DELTASONE) 5 MG tablet Take 1 tablet by mouth daily.     sulfaSALAzine (AZULFIDINE) 500 MG tablet 2 tablet     celecoxib (CELEBREX) 200 MG capsule Take 1 capsule by mouth 2 (two) times daily.     cetirizine (ZYRTEC) 10 MG tablet Take by mouth.     Flaxseed, Linseed, (FLAX SEED OIL) 1000 MG CAPS      Lactobacillus (FLORAJEN ACIDOPHILUS) CAPS Take 1 capsule by mouth daily.     loperamide (IMODIUM A-D) 2 MG tablet Take by mouth.     Probiotic Product [facility]) CAPS      tamsulosin (FLOMAX) 0.4 MG CAPS capsule 1 capsule     traMADol (ULTRAM) 50 MG tablet Take 50 mg by mouth every 6 (six) hours as needed.     No current facility-administered medications on file prior to visit.    Not on File  Objective:  General: Alert and oriented x3 in no acute distress  Dermatology: Keratotic lesion present measuring less than 0.5 cm at the plantar forefoot on the left, sub met 4 with  no skin lines transversing the lesion, pain is present with medial lateral pressure to the lesion, capillaries with pin point bleeding noted, no webspace macerations, no ecchymosis bilateral, all nails x 10 are well manicured.  Vascular: Dorsalis Pedis and Posterior Tibial pedal pulses 1/4, Capillary Fill Time 3 seconds, + pedal hair growth bilateral, no edema bilateral lower extremities, Temperature gradient within normal limits.  Neurology: Johney Maine sensation intact via light touch bilateral.  Musculoskeletal: Mild tenderness with palpation at the lesion site onn left, there is  fat pad atrophy and prominent metatarsal heads bilateral with a significant cavus foot deformity.  Assessment and Plan: Problem List Items Addressed This Visit   None Visit Diagnoses     Benign skin lesion    -  Primary   Plantar wart, left foot       Left foot pain           -Complete examination performed -Discussed treatment options for wart on the left -Parred keratoic warty lesion using a chisel blade; treated the area with Catharidin covered with bandaid; Advised patient of blistering reaction that will occur from application of medication and once this happens replace bandaid with neosporin and tape/bandaid -Advised good supportive shoes daily for foot type -Patient to return to office in 1 month for wart recheck or sooner if condition worsens.  Landis Martins, DPM

## 2021-03-03 NOTE — Addendum Note (Signed)
Addended by: Cranford Mon R on: 03/03/2021 04:20 PM   Modules accepted: Orders

## 2021-04-06 ENCOUNTER — Encounter: Payer: Self-pay | Admitting: Sports Medicine

## 2021-04-06 ENCOUNTER — Ambulatory Visit (INDEPENDENT_AMBULATORY_CARE_PROVIDER_SITE_OTHER): Payer: PPO | Admitting: Sports Medicine

## 2021-04-06 DIAGNOSIS — B070 Plantar wart: Secondary | ICD-10-CM

## 2021-04-06 DIAGNOSIS — L989 Disorder of the skin and subcutaneous tissue, unspecified: Secondary | ICD-10-CM | POA: Diagnosis not present

## 2021-04-06 DIAGNOSIS — M79672 Pain in left foot: Secondary | ICD-10-CM

## 2021-04-06 NOTE — Progress Notes (Signed)
Subjective: Frank Zuniga is a 81 y.o. male patient who presents to office for follow-up evaluation of wart at the ball of the left foot.  Reports that it is better but it still hurts from time to time denies any type of peeling or blister reaction after last visit. Patient denies any other pedal complaints.   Patient has a history of rheumatoid arthritis on Plaquenil.  Patient Active Problem List   Diagnosis Date Noted   Degeneration of lumbar intervertebral disc 03/02/2021   Gastro-esophageal reflux disease with esophagitis 03/02/2021   Primary generalized (osteo)arthritis 03/02/2021   Hospital discharge follow-up 03/03/2020   Hypokalemia 03/03/2020   S/P lumbar laminectomy 02/17/2020   Facet hypertrophy of lumbar region 09/20/2019   Acute pain of right knee 12/16/2018   Primary osteoarthritis of right hip 12/16/2018   Actinic keratosis 06/26/2018   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   Environmental and seasonal allergies 06/26/2018   Lumbar back pain with radiculopathy affecting lower extremity 06/26/2018   Prostate cancer ([place]) 12/13/2016   Rheumatoid arthritis involving multiple sites with positive rheumatoid factor ([place]) 12/13/2016   Essential hypertension 08/30/2016   Medicare annual wellness visit, subsequent 08/30/2016   Conductive hearing loss in left ear 05/06/2015   Dysfunction of left eustachian tube 05/06/2015    Current Outpatient Medications on File Prior to Visit  Medication Sig Dispense Refill   methylPREDNISolone (MEDROL DOSEPAK) 4 MG TBPK tablet See admin instructions.     aspirin 81 MG EC tablet Take by mouth.     calcium carbonate (SUPER CALCIUM) 1500 (600 Ca) MG TABS tablet 1 tablet with meals     celecoxib (CELEBREX) 200 MG capsule Take 1 capsule by mouth 2 (two) times daily.     Cetirizine HCl (ZYRTEC PO) Take by mouth.     Cholecalciferol 25 MCG (1000 UT) tablet Take by mouth.     esomeprazole (NEXIUM) 40 MG capsule 1 capsule      folic acid (FOLVITE) 1 MG tablet TAKE ONE TABLET BY MOUTH EVERY DAY 30     hydroxychloroquine (PLAQUENIL) 200 MG tablet 1 tablet with food or milk     Krill Oil 500 MG CAPS See admin instructions.     Lactobacillus (FLORAJEN ACIDOPHILUS) CAPS Take 1 capsule by mouth daily.     methotrexate 50 MG/2ML injection INJECT 1 MILLILITER SUBCUTANEOUSLY EVERY WEEK.     Misc Natural Products (URINOZINC PO) Take by mouth.     Multiple Vitamins-Minerals (CENTRUM SILVER 50+MEN) TABS Take by mouth.     Niacin (VITAMIN B-3 PO) Take by mouth.     NIACIN PO Take by mouth.     Olmesartan-amLODIPine-HCTZ (TRIBENZOR) 40-5-12.5 MG TABS 1 tablet     predniSONE (DELTASONE) 5 MG tablet Take 1 tablet by mouth daily.     Probiotic Product [facility]) CAPS      sulfaSALAzine (AZULFIDINE) 500 MG tablet 2 tablet     tamsulosin (FLOMAX) 0.4 MG CAPS capsule 1 capsule     No current facility-administered medications on file prior to visit.    Allergies  Allergen Reactions   Prevnar 13 [Pneumococcal 13-Val Conj Vacc]     Objective:  General: Alert and oriented x3 in no acute distress  Dermatology: Keratotic lesion present measuring less than 0.5 cm at the plantar forefoot on the left, sub met 4 with no skin lines transversing the lesion, pain is present with medial lateral pressure to the lesion, capillaries with pin point bleeding noted, no webspace macerations, no ecchymosis  bilateral, all nails x 10 are well manicured.  Vascular: Dorsalis Pedis and Posterior Tibial pedal pulses 1/4, Capillary Fill Time 3 seconds, + pedal hair growth bilateral, no edema bilateral lower extremities, Temperature gradient within normal limits.  Neurology: Johney Maine sensation intact via light touch bilateral.  Musculoskeletal: Mild tenderness with palpation at the lesion site onn left, there is fat pad atrophy and prominent metatarsal heads bilateral with a significant cavus foot deformity.  Assessment and Plan: Problem List Items  Addressed This Visit   None Visit Diagnoses     Benign skin lesion    -  Primary   Plantar wart, left foot       Left foot pain           -Complete examination performed -Discussed treatment options for wart on the left -Parred keratoic warty lesion using a chisel blade; treated the area with Catharidin covered with bandaid; Advised patient of blistering reaction that will occur from application of medication and once this happens replace bandaid with neosporin and tape/bandaid.  This is treatment #2 to the area -Advised good supportive shoes daily for foot type like previous -Patient to return to office in 1 month for wart recheck or sooner if condition worsens.  Landis Martins, DPM

## 2021-05-14 ENCOUNTER — Ambulatory Visit (INDEPENDENT_AMBULATORY_CARE_PROVIDER_SITE_OTHER): Payer: PPO | Admitting: Sports Medicine

## 2021-05-14 ENCOUNTER — Encounter: Payer: Self-pay | Admitting: Sports Medicine

## 2021-05-14 DIAGNOSIS — L989 Disorder of the skin and subcutaneous tissue, unspecified: Secondary | ICD-10-CM

## 2021-05-14 DIAGNOSIS — M79672 Pain in left foot: Secondary | ICD-10-CM

## 2021-05-14 DIAGNOSIS — B070 Plantar wart: Secondary | ICD-10-CM

## 2021-05-14 NOTE — Progress Notes (Signed)
Subjective: Frank Zuniga is a 81 y.o. male patient who presents to office for follow-up evaluation of wart at the ball of the left foot.  Reports that he is not sure if it is getting better and states that every now again he does get a sharp pain to the area randomly. Patient has a history of rheumatoid arthritis on Plaquenil as previously noted. Patient is assisted by wife this visit.  Patient Active Problem List   Diagnosis Date Noted   Degeneration of lumbar intervertebral disc 03/02/2021   Gastro-esophageal reflux disease with esophagitis 03/02/2021   Primary generalized (osteo)arthritis 03/02/2021   Hospital discharge follow-up 03/03/2020   Hypokalemia 03/03/2020   S/P lumbar laminectomy 02/17/2020   Facet hypertrophy of lumbar region 09/20/2019   Acute pain of right knee 12/16/2018   Primary osteoarthritis of right hip 12/16/2018   Actinic keratosis 06/26/2018   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   Environmental and seasonal allergies 06/26/2018   Lumbar back pain with radiculopathy affecting lower extremity 06/26/2018   Prostate cancer ([place]) 12/13/2016   Rheumatoid arthritis involving multiple sites with positive rheumatoid factor ([place]) 12/13/2016   Essential hypertension 08/30/2016   Medicare annual wellness visit, subsequent 08/30/2016   Conductive hearing loss in left ear 05/06/2015   Dysfunction of left eustachian tube 05/06/2015    Current Outpatient Medications on File Prior to Visit  Medication Sig Dispense Refill   aspirin 81 MG EC tablet Take by mouth.     calcium carbonate (SUPER CALCIUM) 1500 (600 Ca) MG TABS tablet 1 tablet with meals     celecoxib (CELEBREX) 200 MG capsule Take 1 capsule by mouth 2 (two) times daily.     Cetirizine HCl (ZYRTEC PO) Take by mouth.     Cholecalciferol 25 MCG (1000 UT) tablet Take by mouth.     esomeprazole (NEXIUM) 40 MG capsule 1 capsule     folic acid (FOLVITE) 1 MG tablet TAKE ONE TABLET BY MOUTH EVERY  DAY 30     hydroxychloroquine (PLAQUENIL) 200 MG tablet 1 tablet with food or milk     Krill Oil 500 MG CAPS See admin instructions.     Lactobacillus (FLORAJEN ACIDOPHILUS) CAPS Take 1 capsule by mouth daily.     methotrexate 50 MG/2ML injection INJECT 1 MILLILITER SUBCUTANEOUSLY EVERY WEEK.     methylPREDNISolone (MEDROL DOSEPAK) 4 MG TBPK tablet See admin instructions.     Misc Natural Products (URINOZINC PO) Take by mouth.     Multiple Vitamins-Minerals (CENTRUM SILVER 50+MEN) TABS Take by mouth.     Niacin (VITAMIN B-3 PO) Take by mouth.     NIACIN PO Take by mouth.     Olmesartan-amLODIPine-HCTZ (TRIBENZOR) 40-5-12.5 MG TABS 1 tablet     predniSONE (DELTASONE) 5 MG tablet Take 1 tablet by mouth daily.     Probiotic Product [facility]) CAPS      sulfaSALAzine (AZULFIDINE) 500 MG tablet 2 tablet     tamsulosin (FLOMAX) 0.4 MG CAPS capsule 1 capsule     No current facility-administered medications on file prior to visit.    Allergies  Allergen Reactions   Prevnar 13 [Pneumococcal 13-Val Conj Vacc]     Objective:  General: Alert and oriented x3 in no acute distress  Dermatology: Keratotic lesion present measuring less than 0.5 cm at the plantar forefoot on the left, sub met 4 with no skin lines transversing the lesion, pain is present with medial lateral pressure to the lesion, capillaries with pin point bleeding noted  and a very minimal central nucleated core noted as well, no webspace macerations, no ecchymosis bilateral, all nails x 10 are well manicured.  Vascular: Dorsalis Pedis and Posterior Tibial pedal pulses 1/4, Capillary Fill Time 3 seconds, + pedal hair growth bilateral, no edema bilateral lower extremities, Temperature gradient within normal limits.  Neurology: Johney Maine sensation intact via light touch bilateral.  Musculoskeletal: Mild tenderness with palpation at the lesion site onn left, there is fat pad atrophy and prominent metatarsal heads bilateral with a  significant cavus foot deformity.  Assessment and Plan: Problem List Items Addressed This Visit   None Visit Diagnoses     Benign skin lesion    -  Primary   Plantar wart, left foot       Left foot pain           -Complete examination performed -Discussed treatment options for wart on the left -Parred keratoic warty lesion using a chisel blade; treated the area with Salinocaine covered with bandaid -Advised good supportive shoes daily for foot type like previous -Patient to return to office in 1 month for wart recheck or sooner if condition worsens.  Advised patient at this time that I feel like the lesion still looks like a wart or a deep-seated porokeratosis I also gave patient offloading padding to see if this will help with resolution of the lesion.  Landis Martins, DPM

## 2021-06-11 ENCOUNTER — Encounter: Payer: Self-pay | Admitting: Sports Medicine

## 2021-06-11 ENCOUNTER — Other Ambulatory Visit: Payer: Self-pay

## 2021-06-11 ENCOUNTER — Ambulatory Visit (INDEPENDENT_AMBULATORY_CARE_PROVIDER_SITE_OTHER): Payer: PPO | Admitting: Sports Medicine

## 2021-06-11 DIAGNOSIS — B070 Plantar wart: Secondary | ICD-10-CM | POA: Diagnosis not present

## 2021-06-11 DIAGNOSIS — L989 Disorder of the skin and subcutaneous tissue, unspecified: Secondary | ICD-10-CM

## 2021-06-11 DIAGNOSIS — M79672 Pain in left foot: Secondary | ICD-10-CM

## 2021-06-11 NOTE — Progress Notes (Signed)
Subjective: ?Frank Zuniga is a 81 y.o. male patient who presents to office for follow-up evaluation of wart at the ball of the left foot.  Reports that he is doing better.  Denies any changes at this time. ? ?Patient Active Problem List  ? Diagnosis Date Noted  ? Degeneration of lumbar intervertebral disc 03/02/2021  ? Gastro-esophageal reflux disease with esophagitis 03/02/2021  ? Primary generalized (osteo)arthritis 03/02/2021  ? Hospital discharge follow-up 03/03/2020  ? Hypokalemia 03/03/2020  ? S/P lumbar laminectomy 02/17/2020  ? Facet hypertrophy of lumbar region 09/20/2019  ? Acute pain of right knee 12/16/2018  ? Primary osteoarthritis of right hip 12/16/2018  ? Actinic keratosis 06/26/2018  ? 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 Environmental and seasonal allergies 06/26/2018  ? Lumbar back pain with radiculopathy affecting lower extremity 06/26/2018  ? Prostate cancer ([place]) 12/13/2016  ? Rheumatoid arthritis involving multiple sites with positive rheumatoid factor ([place]) 12/13/2016  ? Essential hypertension 08/30/2016  ? Medicare annual wellness visit, subsequent 08/30/2016  ? Conductive hearing loss in left ear 05/06/2015  ? Dysfunction of left eustachian tube 05/06/2015  ? ? ?Current Outpatient Medications on File Prior to Visit  ?Medication Sig Dispense Refill  ? aspirin 81 MG EC tablet Take by mouth.    ? calcium carbonate (SUPER CALCIUM) 1500 (600 Ca) MG TABS tablet 1 tablet with meals    ? celecoxib (CELEBREX) 200 MG capsule Take 1 capsule by mouth 2 (two) times daily.    ? cetirizine (ZYRTEC) 10 MG tablet Take 1 tablet by mouth daily.    ? Cholecalciferol 25 MCG (1000 UT) tablet Take by mouth.    ? esomeprazole (NEXIUM) 40 MG capsule 1 capsule    ? folic acid (FOLVITE) 1 MG tablet TAKE ONE TABLET BY MOUTH EVERY DAY 30    ? hydroxychloroquine (PLAQUENIL) 200 MG tablet 1 tablet with food or milk    ? Krill Oil 500 MG CAPS See admin instructions.    ? Lactobacillus (FLORAJEN  ACIDOPHILUS) CAPS Take 1 capsule by mouth daily.    ? loperamide (IMODIUM A-D) 2 MG tablet Take by mouth.    ? methotrexate 50 MG/2ML injection INJECT 1 MILLILITER SUBCUTANEOUSLY EVERY WEEK.    ? Misc Natural Products (URINOZINC PO) Take by mouth.    ? Multiple Vitamins-Minerals (CENTRUM SILVER 50+MEN) TABS Take by mouth.    ? Niacin (VITAMIN B-3 PO) Take by mouth.    ? niacinamide 500 MG tablet 1 tablet    ? Olmesartan-amLODIPine-HCTZ (TRIBENZOR) 40-5-12.5 MG TABS 1 tablet    ? predniSONE (DELTASONE) 5 MG tablet Take 1 tablet by mouth daily.    ? Probiotic Product [facility]) CAPS     ? sulfaSALAzine (AZULFIDINE) 500 MG tablet 2 tablet    ? tamsulosin (FLOMAX) 0.4 MG CAPS capsule 1 capsule    ? ?No current facility-administered medications on file prior to visit.  ? ? ?Allergies  ?Allergen Reactions  ? Prevnar 13 [Pneumococcal 13-Val Conj Vacc]   ? ? ?Objective:  ?General: Alert and oriented x3 in no acute distress ? ?Dermatology: Pinpoint keratotic lesion at the plantar forefoot on the left, sub met 4 with no skin lines transversing the lesion, pain is present with medial lateral pressure to the lesion, capillaries with pin point bleeding noted and a very minimal central nucleated core noted as well, no webspace macerations, no ecchymosis bilateral, all nails x 10 are well manicured. ? ?Vascular: Dorsalis Pedis and Posterior Tibial pedal pulses 1/4, Capillary  Fill Time 3 seconds, + pedal hair growth bilateral, no edema bilateral lower extremities, Temperature gradient within normal limits. ? ?Neurology: Gross sensation intact via light touch bilateral. ? ?Musculoskeletal: Mild tenderness with palpation at the lesion site onn left, there is fat pad atrophy and prominent metatarsal heads bilateral with a significant cavus foot deformity. ? ?Assessment and Plan: ?Problem List Items Addressed This Visit   ?None ?Visit Diagnoses   ? ? Benign skin lesion    -  Primary  ? Plantar wart, left foot      ? Left foot pain       ? ?  ? ? ?-Complete examination performed ?-Discussed treatment options for wart on the left ?-Parred keratoic warty lesion using a chisel blade; treated the area with Salinocaine covered with bandaid and offloading padding ?-Advised good supportive shoes daily for foot type like previous ?-Patient to return to office in 1 month for final wart recheck or sooner if condition worsens.   ? ?Landis Martins, DPM ? ?

## 2021-07-16 ENCOUNTER — Ambulatory Visit: Payer: PPO | Admitting: Sports Medicine

## 2023-08-02 ENCOUNTER — Other Ambulatory Visit: Payer: Self-pay | Admitting: Neurosurgery

## 2023-08-09 ENCOUNTER — Other Ambulatory Visit: Payer: Self-pay | Admitting: Neurosurgery

## 2023-08-09 NOTE — Pre-Procedure Instructions (Signed)
 Surgical Instructions   Your procedure is scheduled on Wednesday, June 4th. Report to [facility] Main Entrance "A" at 11:15 A.M., then check in with the Admitting office. Any questions or running late day of surgery: call [phone]  Questions prior to your surgery date: call [phone], Monday-Friday, 8am-4pm. If you experience any cold or flu symptoms such as cough, fever, chills, shortness of breath, etc. between now and your scheduled surgery, please notify us  at the above number.     Remember:  Do not eat after midnight the night before your surgery  You may drink clear liquids until 10:15 AM the morning of your surgery.   Clear liquids allowed are: Water, Non-Citrus Juices (without pulp), Carbonated Beverages, Clear Tea (no milk, honey, etc.), Black Coffee Only (NO MILK, CREAM OR POWDERED CREAMER of any kind), and Gatorade.    Take these medicines the morning of surgery with A SIP OF WATER  cetirizine (ZYRTEC)  esomeprazole (NEXIUM)  predniSONE (DELTASONE)  tamsulosin (FLOMAX)     Follow surgeon's instructions regarding hydroxychloroquine (PLAQUENIL) and Methotrexate.  Follow your surgeon's instructions on when to stop Aspirin.  If no instructions were given by your surgeon then you will need to call the office to get those instructions.    One week prior to surgery, STOP taking any Aleve, Naproxen, Ibuprofen, Motrin, Advil, Goody's, BC's, all herbal medications, fish oil, and non-prescription vitamins. This includes celecoxib (CELEBREX).                     Do NOT Smoke (Tobacco/Vaping) for 24 hours prior to your procedure.  If you use a CPAP at night, you may bring your mask/headgear for your overnight stay.   You will be asked to remove any contacts, glasses, piercing's, hearing aid's, dentures/partials prior to surgery. Please bring cases for these items if needed.    Patients discharged the day of surgery will not be allowed to drive home, and someone needs to  stay with them for 24 hours.  SURGICAL WAITING ROOM VISITATION Patients may have no more than 2 support people in the waiting area - these visitors may rotate.   Pre-op nurse will coordinate an appropriate time for 1 ADULT support person, who may not rotate, to accompany patient in pre-op.  Children under the age of 75 must have an adult with them who is not the patient and must remain in the main waiting area with an adult.  If the patient needs to stay at the hospital during part of their recovery, the visitor guidelines for inpatient rooms apply.  Please refer to the [facility] website for the visitor guidelines for any additional information.   If you received a COVID test during your pre-op visit  it is requested that you wear a mask when out in public, stay away from anyone that may not be feeling well and notify your surgeon if you develop symptoms. If you have been in contact with anyone that has tested positive in the last 10 days please notify you surgeon.      Pre-operative 5 CHG Bathing Instructions   You can play a key role in reducing the risk of infection after surgery. Your skin needs to be as free of germs as possible. You can reduce the number of germs on your skin by washing with CHG (chlorhexidine gluconate) soap before surgery. CHG is an antiseptic soap that kills germs and continues to kill germs even after washing.   DO NOT use if you  have an allergy to chlorhexidine/CHG or antibacterial soaps. If your skin becomes reddened or irritated, stop using the CHG and notify one of our RNs at [phone].   Please shower with the CHG soap starting 4 days before surgery using the following schedule:     Please keep in mind the following:  DO NOT shave, including legs and underarms, starting the day of your first shower.   You may shave your face at any point before/day of surgery.  Place clean sheets on your bed the day you start using CHG soap. Use a clean washcloth  (not used since being washed) for each shower. DO NOT sleep with pets once you start using the CHG.   CHG Shower Instructions:  Wash your face and private area with normal soap. If you choose to wash your hair, wash first with your normal shampoo.  After you use shampoo/soap, rinse your hair and body thoroughly to remove shampoo/soap residue.  Turn the water OFF and apply about 3 tablespoons (45 ml) of CHG soap to a CLEAN washcloth.  Apply CHG soap ONLY FROM YOUR NECK DOWN TO YOUR TOES (washing for 3-5 minutes)  DO NOT use CHG soap on face, private areas, open wounds, or sores.  Pay special attention to the area where your surgery is being performed.  If you are having back surgery, having someone wash your back for you may be helpful. Wait 2 minutes after CHG soap is applied, then you may rinse off the CHG soap.  Pat dry with a clean towel  Put on clean clothes/pajamas   If you choose to wear lotion, please use ONLY the CHG-compatible lotions that are listed below.  Additional instructions for the day of surgery: DO NOT APPLY any lotions, deodorants, cologne, or perfumes.   Do not bring valuables to the hospital. [facility] is not responsible for any belongings/valuables. Do not wear nail polish, gel polish, artificial nails, or any other type of covering on natural nails (fingers and toes) Do not wear jewelry or makeup Put on clean/comfortable clothes.  Please brush your teeth.  Ask your nurse before applying any prescription medications to the skin.     CHG Compatible Lotions   Aveeno Moisturizing lotion  Cetaphil Moisturizing Cream  Cetaphil Moisturizing Lotion  Clairol Herbal Essence Moisturizing Lotion, Dry Skin  Clairol Herbal Essence Moisturizing Lotion, Extra Dry Skin  Clairol Herbal Essence Moisturizing Lotion, Normal Skin  Curel Age Defying Therapeutic Moisturizing Lotion with Alpha Hydroxy  Curel Extreme Care Body Lotion  Curel Soothing Hands Moisturizing Hand  Lotion  Curel Therapeutic Moisturizing Cream, Fragrance-Free  Curel Therapeutic Moisturizing Lotion, Fragrance-Free  Curel Therapeutic Moisturizing Lotion, Original Formula  Eucerin Daily Replenishing Lotion  Eucerin Dry Skin Therapy Plus Alpha Hydroxy Crme  Eucerin Dry Skin Therapy Plus Alpha Hydroxy Lotion  Eucerin Original Crme  Eucerin Original Lotion  Eucerin Plus Crme Eucerin Plus Lotion  Eucerin TriLipid Replenishing Lotion  Keri Anti-Bacterial Hand Lotion  Keri Deep Conditioning Original Lotion Dry Skin Formula Softly Scented  Keri Deep Conditioning Original Lotion, Fragrance Free Sensitive Skin Formula  Keri Lotion Fast Absorbing Fragrance Free Sensitive Skin Formula  Keri Lotion Fast Absorbing Softly Scented Dry Skin Formula  Keri Original Lotion  Keri Skin Renewal Lotion Keri Silky Smooth Lotion  Keri Silky Smooth Sensitive Skin Lotion  Nivea Body Creamy Conditioning Oil  Nivea Body Extra Enriched Teacher, adult education Moisturizing Lotion Nivea Crme  Nivea Skin Firming Lotion  NutraDerm  30 Skin Lotion  NutraDerm Skin Lotion  NutraDerm Therapeutic Skin Cream  NutraDerm Therapeutic Skin Lotion  ProShield Protective Hand Cream  Provon moisturizing lotion  Please read over the following fact sheets that you were given.

## 2023-08-10 ENCOUNTER — Encounter (HOSPITAL_COMMUNITY): Payer: Self-pay

## 2023-08-10 ENCOUNTER — Other Ambulatory Visit: Payer: Self-pay

## 2023-08-10 ENCOUNTER — Encounter (HOSPITAL_COMMUNITY)
Admission: RE | Admit: 2023-08-10 | Discharge: 2023-08-10 | Disposition: A | Discharge status: Home / Self Care | Source: Ambulatory Visit | Attending: Neurosurgery | Admitting: Neurosurgery

## 2023-08-10 VITALS — BP 162/66 | HR 96 | Temp 97.7°F | Resp 18 | Ht 65.0 in | Wt 154.3 lb

## 2023-08-10 DIAGNOSIS — Z01818 Encounter for other preprocedural examination: Secondary | ICD-10-CM | POA: Diagnosis present

## 2023-08-10 DIAGNOSIS — Z01812 Encounter for preprocedural laboratory examination: Secondary | ICD-10-CM | POA: Diagnosis not present

## 2023-08-10 HISTORY — DX: Gastro-esophageal reflux disease without esophagitis: K21.9

## 2023-08-10 HISTORY — DX: Malignant (primary) neoplasm, unspecified: C80.1

## 2023-08-10 HISTORY — DX: Unspecified osteoarthritis, unspecified site: M19.90

## 2023-08-10 HISTORY — DX: Essential (primary) hypertension: I10

## 2023-08-10 HISTORY — DX: Family history of other specified conditions: Z84.89

## 2023-08-10 LAB — TYPE AND SCREEN
ABO/RH(D): B POS
Antibody Screen: NEGATIVE

## 2023-08-10 LAB — SURGICAL PCR SCREEN
MRSA, PCR: NEGATIVE
Staphylococcus aureus: NEGATIVE

## 2023-08-10 NOTE — Pre-Procedure Instructions (Signed)
 Surgical Instructions   Your procedure is scheduled on Wednesday, June 4th. Report to [facility] Main Entrance "A" at 11:15 A.M., then check in with the Admitting office. Any questions or running late day of surgery: call [phone]  Questions prior to your surgery date: call [phone], Monday-Friday, 8am-4pm. If you experience any cold or flu symptoms such as cough, fever, chills, shortness of breath, etc. between now and your scheduled surgery, please notify us  at the above number.     Remember:  Do not eat or drink after midnight the night before your surgery   Take these medicines the morning of surgery with A SIP OF WATER  cetirizine (ZYRTEC)  esomeprazole (NEXIUM)  predniSONE (DELTASONE)  tamsulosin (FLOMAX)   Follow surgeon's instructions regarding hydroxychloroquine (PLAQUENIL) and Methotrexate.  Follow your surgeon's instructions on when to stop Aspirin.  If no instructions were given by your surgeon then you will need to call the office to get those instructions.    One week prior to surgery, STOP taking any Aleve, Naproxen, Ibuprofen, Motrin, Advil, Goody's, BC's, all herbal medications, fish oil, and non-prescription vitamins. This includes celecoxib (CELEBREX).                     Do NOT Smoke (Tobacco/Vaping) for 24 hours prior to your procedure.  If you use a CPAP at night, you may bring your mask/headgear for your overnight stay.   You will be asked to remove any contacts, glasses, piercing's, hearing aid's, dentures/partials prior to surgery. Please bring cases for these items if needed.    Patients discharged the day of surgery will not be allowed to drive home, and someone needs to stay with them for 24 hours.  SURGICAL WAITING ROOM VISITATION Patients may have no more than 2 support people in the waiting area - these visitors may rotate.   Pre-op nurse will coordinate an appropriate time for 1 ADULT support person, who may not rotate, to accompany patient  in pre-op.  Children under the age of 58 must have an adult with them who is not the patient and must remain in the main waiting area with an adult.  If the patient needs to stay at the hospital during part of their recovery, the visitor guidelines for inpatient rooms apply.  Please refer to the [facility] website for the visitor guidelines for any additional information.   If you received a COVID test during your pre-op visit  it is requested that you wear a mask when out in public, stay away from anyone that may not be feeling well and notify your surgeon if you develop symptoms. If you have been in contact with anyone that has tested positive in the last 10 days please notify you surgeon.      Pre-operative 5 CHG Bathing Instructions   You can play a key role in reducing the risk of infection after surgery. Your skin needs to be as free of germs as possible. You can reduce the number of germs on your skin by washing with CHG (chlorhexidine gluconate) soap before surgery. CHG is an antiseptic soap that kills germs and continues to kill germs even after washing.   DO NOT use if you have an allergy to chlorhexidine/CHG or antibacterial soaps. If your skin becomes reddened or irritated, stop using the CHG and notify one of our RNs at [phone].   Please shower with the CHG soap starting 4 days before surgery using the following schedule:  Please keep in mind the following:  DO NOT shave, including legs and underarms, starting the day of your first shower.   You may shave your face at any point before/day of surgery.  Place clean sheets on your bed the day you start using CHG soap. Use a clean washcloth (not used since being washed) for each shower. DO NOT sleep with pets once you start using the CHG.   CHG Shower Instructions:  Wash your face and private area with normal soap. If you choose to wash your hair, wash first with your normal shampoo.  After you use shampoo/soap,  rinse your hair and body thoroughly to remove shampoo/soap residue.  Turn the water OFF and apply about 3 tablespoons (45 ml) of CHG soap to a CLEAN washcloth.  Apply CHG soap ONLY FROM YOUR NECK DOWN TO YOUR TOES (washing for 3-5 minutes)  DO NOT use CHG soap on face, private areas, open wounds, or sores.  Pay special attention to the area where your surgery is being performed.  If you are having back surgery, having someone wash your back for you may be helpful. Wait 2 minutes after CHG soap is applied, then you may rinse off the CHG soap.  Pat dry with a clean towel  Put on clean clothes/pajamas   If you choose to wear lotion, please use ONLY the CHG-compatible lotions that are listed below.  Additional instructions for the day of surgery: DO NOT APPLY any lotions, deodorants, cologne, or perfumes.   Do not bring valuables to the hospital. [facility] is not responsible for any belongings/valuables. Do not wear nail polish, gel polish, artificial nails, or any other type of covering on natural nails (fingers and toes) Do not wear jewelry or makeup Put on clean/comfortable clothes.  Please brush your teeth.  Ask your nurse before applying any prescription medications to the skin.     CHG Compatible Lotions   Aveeno Moisturizing lotion  Cetaphil Moisturizing Cream  Cetaphil Moisturizing Lotion  Clairol Herbal Essence Moisturizing Lotion, Dry Skin  Clairol Herbal Essence Moisturizing Lotion, Extra Dry Skin  Clairol Herbal Essence Moisturizing Lotion, Normal Skin  Curel Age Defying Therapeutic Moisturizing Lotion with Alpha Hydroxy  Curel Extreme Care Body Lotion  Curel Soothing Hands Moisturizing Hand Lotion  Curel Therapeutic Moisturizing Cream, Fragrance-Free  Curel Therapeutic Moisturizing Lotion, Fragrance-Free  Curel Therapeutic Moisturizing Lotion, Original Formula  Eucerin Daily Replenishing Lotion  Eucerin Dry Skin Therapy Plus Alpha Hydroxy Crme  Eucerin Dry Skin  Therapy Plus Alpha Hydroxy Lotion  Eucerin Original Crme  Eucerin Original Lotion  Eucerin Plus Crme Eucerin Plus Lotion  Eucerin TriLipid Replenishing Lotion  Keri Anti-Bacterial Hand Lotion  Keri Deep Conditioning Original Lotion Dry Skin Formula Softly Scented  Keri Deep Conditioning Original Lotion, Fragrance Free Sensitive Skin Formula  Keri Lotion Fast Absorbing Fragrance Free Sensitive Skin Formula  Keri Lotion Fast Absorbing Softly Scented Dry Skin Formula  Keri Original Lotion  Keri Skin Renewal Lotion Keri Silky Smooth Lotion  Keri Silky Smooth Sensitive Skin Lotion  Nivea Body Creamy Conditioning Oil  Nivea Body Extra Enriched Lotion  Nivea Body Original Lotion  Nivea Body Sheer Moisturizing Lotion Nivea Crme  Nivea Skin Firming Lotion  NutraDerm 30 Skin Lotion  NutraDerm Skin Lotion  NutraDerm Therapeutic Skin Cream  NutraDerm Therapeutic Skin Lotion  ProShield Protective Hand Cream  Provon moisturizing lotion  Please read over the following fact sheets that you were given.

## 2023-08-10 NOTE — Progress Notes (Signed)
 PCP - Dr. Lucrezia Sachs Cardiologist -   PPM/ICD - denies Device Orders - na Rep Notified - na  Chest x-ray - na EKG - 08/02/2023, CE, requested from Dr. Christia Cowboy office 08/10/2023 Stress Test - per patient approx 12 years ago ECHO -  Cardiac Cath -   Sleep Study - denies CPAP - na  Non-diabetic  Blood Thinner Instructions: denies Aspirin Instructions: follow surgeon's instructions  ERAS Protcol - NPO  Anesthesia review: No  Patient denies shortness of breath, fever, cough and chest pain at PAT appointment   All instructions explained to the patient, with a verbal understanding of the material. Patient agrees to go over the instructions while at home for a better understanding. Patient also instructed to self quarantine after being tested for COVID-19. The opportunity to ask questions was provided.

## 2023-08-23 ENCOUNTER — Other Ambulatory Visit: Payer: Self-pay

## 2023-08-23 ENCOUNTER — Ambulatory Visit (HOSPITAL_COMMUNITY): Admitting: Anesthesiology

## 2023-08-23 ENCOUNTER — Ambulatory Visit (HOSPITAL_COMMUNITY)
Admission: RE | Admit: 2023-08-23 | Discharge: 2023-08-23 | Disposition: A | Discharge status: Home / Self Care | Attending: Neurosurgery | Admitting: Neurosurgery

## 2023-08-23 ENCOUNTER — Ambulatory Visit (HOSPITAL_COMMUNITY)

## 2023-08-23 ENCOUNTER — Encounter (HOSPITAL_COMMUNITY): Payer: Self-pay | Admitting: Neurosurgery

## 2023-08-23 ENCOUNTER — Encounter (HOSPITAL_COMMUNITY)
Admission: RE | Disposition: A | Discharge status: Home / Self Care | Payer: Self-pay | Source: Home / Self Care | Attending: Neurosurgery

## 2023-08-23 DIAGNOSIS — M4316 Spondylolisthesis, lumbar region: Secondary | ICD-10-CM | POA: Diagnosis present

## 2023-08-23 DIAGNOSIS — K219 Gastro-esophageal reflux disease without esophagitis: Secondary | ICD-10-CM | POA: Diagnosis not present

## 2023-08-23 DIAGNOSIS — M48062 Spinal stenosis, lumbar region with neurogenic claudication: Secondary | ICD-10-CM | POA: Insufficient documentation

## 2023-08-23 DIAGNOSIS — I10 Essential (primary) hypertension: Secondary | ICD-10-CM | POA: Insufficient documentation

## 2023-08-23 DIAGNOSIS — Z87891 Personal history of nicotine dependence: Secondary | ICD-10-CM | POA: Insufficient documentation

## 2023-08-23 DIAGNOSIS — Z539 Procedure and treatment not carried out, unspecified reason: Secondary | ICD-10-CM | POA: Insufficient documentation

## 2023-08-23 DIAGNOSIS — Z79899 Other long term (current) drug therapy: Secondary | ICD-10-CM | POA: Diagnosis not present

## 2023-08-23 DIAGNOSIS — M5117 Intervertebral disc disorders with radiculopathy, lumbosacral region: Secondary | ICD-10-CM | POA: Diagnosis not present

## 2023-08-23 LAB — ABO/RH: ABO/RH(D): B POS

## 2023-08-23 SURGERY — POSTERIOR LUMBAR FUSION 1 LEVEL
Anesthesia: General

## 2023-08-23 MED ORDER — PHENYLEPHRINE 80 MCG/ML (10ML) SYRINGE FOR IV PUSH (FOR BLOOD PRESSURE SUPPORT)
PREFILLED_SYRINGE | INTRAVENOUS | Status: AC
  Filled 2023-08-23: qty 10

## 2023-08-23 MED ORDER — DEXAMETHASONE SODIUM PHOSPHATE 10 MG/ML IJ SOLN
INTRAMUSCULAR | Status: AC
  Filled 2023-08-23: qty 1

## 2023-08-23 MED ORDER — ACETAMINOPHEN 500 MG PO TABS
1000.0000 mg | ORAL_TABLET | Freq: Once | ORAL | Status: AC
  Administered 2023-08-23: 1000 mg via ORAL
  Filled 2023-08-23: qty 2

## 2023-08-23 MED ORDER — FENTANYL CITRATE (PF) 250 MCG/5ML IJ SOLN
INTRAMUSCULAR | Status: AC
  Filled 2023-08-23: qty 5

## 2023-08-23 MED ORDER — LIDOCAINE 2% (20 MG/ML) 5 ML SYRINGE
INTRAMUSCULAR | Status: AC
  Filled 2023-08-23: qty 5

## 2023-08-23 MED ORDER — CHLORHEXIDINE GLUCONATE CLOTH 2 % EX PADS: 6.0000 | MEDICATED_PAD | Freq: Once | CUTANEOUS | Status: DC

## 2023-08-23 MED ORDER — LACTATED RINGERS IV SOLN: INTRAVENOUS | Status: DC

## 2023-08-23 MED ORDER — ONDANSETRON HCL 4 MG/2ML IJ SOLN
INTRAMUSCULAR | Status: AC
  Filled 2023-08-23: qty 2

## 2023-08-23 MED ORDER — ORAL CARE MOUTH RINSE: 15.0000 mL | Freq: Once | OROMUCOSAL | Status: AC

## 2023-08-23 MED ORDER — PROPOFOL 10 MG/ML IV BOLUS
INTRAVENOUS | Status: AC
  Filled 2023-08-23: qty 20

## 2023-08-23 MED ORDER — ROCURONIUM BROMIDE 10 MG/ML (PF) SYRINGE
PREFILLED_SYRINGE | INTRAVENOUS | Status: AC
  Filled 2023-08-23: qty 10

## 2023-08-23 MED ORDER — CHLORHEXIDINE GLUCONATE 0.12 % MT SOLN
15.0000 mL | Freq: Once | OROMUCOSAL | Status: AC
  Administered 2023-08-23: 15 mL via OROMUCOSAL
  Filled 2023-08-23: qty 15

## 2023-08-23 MED ORDER — CEFAZOLIN SODIUM-DEXTROSE 2-4 GM/100ML-% IV SOLN
2.0000 g | INTRAVENOUS | Status: DC
  Filled 2023-08-23: qty 100

## 2023-08-23 NOTE — Progress Notes (Signed)
 Surgery cancelled today by anesthesia d/t pt needing further cardiac workup. Dr. Larrie Po and OR made aware. PIV removed. Pt d/c to home with family.   Pia Brew, RN

## 2023-08-23 NOTE — Anesthesia Preprocedure Evaluation (Addendum)
 Anesthesia Evaluation  Patient identified by MRN, date of birth, ID band Patient awake    Reviewed: Allergy & Precautions, NPO status , Patient's Chart, lab work & pertinent test results  Airway Mallampati: II  TM Distance: >3 FB Neck ROM: Full    Dental  (+) Edentulous Upper, Partial Lower   Pulmonary neg recent URI, former smoker   Pulmonary exam normal breath sounds clear to auscultation       Cardiovascular hypertension, Pt. on medications  Rhythm:Regular Rate:Normal + Systolic murmurs    Neuro/Psych negative neurological ROS     GI/Hepatic Neg liver ROS,GERD  Medicated and Controlled,,  Endo/Other  negative endocrine ROS    Renal/GU negative Renal ROS     Musculoskeletal  (+) Arthritis ,    Abdominal   Peds  Hematology negative hematology ROS (+)   Anesthesia Other Findings   Reproductive/Obstetrics                             Anesthesia Physical Anesthesia Plan  ASA: 4  Anesthesia Plan: General   Post-op Pain Management: Tylenol PO (pre-op)*   Induction: Intravenous  PONV Risk Score and Plan: Propofol infusion, TIVA, Treatment may vary due to age or medical condition, Ondansetron and Dexamethasone  Airway Management Planned: Oral ETT  Additional Equipment:   Intra-op Plan:   Post-operative Plan: Extubation in OR  Informed Consent: I have reviewed the patients History and Physical, chart, labs and discussed the procedure including the risks, benefits and alternatives for the proposed anesthesia with the patient or authorized representative who has indicated his/her understanding and acceptance.     Dental advisory given  Plan Discussed with: CRNA  Anesthesia Plan Comments: (Risks of anesthesia explained at length. This includes, but is not limited to, sore throat, damage to teeth, lips gums, tongue and vocal cords, nausea and vomiting, reactions to medications,  stroke, heart attack, and death. All patient questions were answered and the patient wishes to proceed.    In the process of examining the patient I discovered a systolic murmur that does not appear to have every been noted or worked up. I discussed this with Dr. Larrie Po and I feel the patient should be evaluated prior to elective surgery given his age and lack of functional status as this could be aortic stenosis. I recommend getting him evaluated by cardiology. )         Anesthesia Quick Evaluation

## 2023-08-23 NOTE — H&P (Signed)
 Subjective: The patient is an 83 year old white male who has complained of back and bilateral leg pain consistent with lumbar radiculopathy/neurogenic claudication.  He has failed medical management and was worked up with a lumbar MRI which demonstrated an L4-5 spondylolisthesis and stenosis and right L5-S1 stenosis.  I discussed the various treatment options with him.  He has decided to proceed with surgery.  Past Medical History:  Diagnosis Date   Arthritis    Cancer [facility])    prostate   Family history of adverse reaction to anesthesia    Died with surgery; chest surgery   GERD (gastroesophageal reflux disease)    Hypertension     Past Surgical History:  Procedure Laterality Date   HERNIA REPAIR     x 3   PROSTATE SURGERY     TONSILLECTOMY      Allergies  Allergen Reactions   Prevnar 13 [Pneumococcal 13-Val Conj Vacc]     Fever, chills    Social History   Tobacco Use   Smoking status: Former    Current packs/day: 0.00    Types: Cigarettes    Quit date: 1974    Years since quitting: 51.4   Smokeless tobacco: Never  Substance Use Topics   Alcohol use: Never    History reviewed. No pertinent family history. Prior to Admission medications   Medication Sig Start Date End Date Taking? Authorizing Provider  aspirin 81 MG EC tablet Take 81 mg by mouth daily. 02/08/20  Yes [provider]  calcium carbonate (SUPER CALCIUM) 1500 (600 Ca) MG TABS tablet Take 600 mg of elemental calcium by mouth daily. 02/08/20  Yes [provider]  celecoxib (CELEBREX) 200 MG capsule Take 200 mg by mouth 2 (two) times daily.   Yes [provider]  cetirizine (ZYRTEC) 10 MG tablet Take 10 mg by mouth daily.   Yes [provider]  Cholecalciferol 25 MCG (1000 UT) tablet Take 1,000 Units by mouth daily. 02/08/20  Yes [provider]  esomeprazole (NEXIUM) 40 MG capsule Take 40 mg by mouth daily. 12/21/16  Yes [provider]  folic acid (FOLVITE) 1  MG tablet Take 1 mg by mouth daily. 04/15/15  Yes [provider]  hydroxychloroquine (PLAQUENIL) 200 MG tablet Take 200 mg by mouth 2 (two) times daily. 04/15/15  Yes [provider]  Oksana Bergamo Oil 500 MG CAPS Take 500 mg by mouth daily. 02/08/20  Yes [provider]  methotrexate 50 MG/2ML injection Inject 25 mg into the muscle once a week. 04/15/15  Yes [provider]  Multiple Vitamins-Minerals (CENTRUM SILVER 50+MEN) TABS Take 1 tablet by mouth daily. 02/08/20  Yes [provider]  Niacin (VITAMIN B-3 PO) Take 1 tablet by mouth daily.   Yes [provider]  Olmesartan-amLODIPine-HCTZ (TRIBENZOR) 40-5-12.5 MG TABS Take 1 tablet by mouth daily. 03/26/18  Yes [provider]  predniSONE (DELTASONE) 5 MG tablet Take 5 mg by mouth daily. 01/30/18  Yes [provider]  sulfaSALAzine (AZULFIDINE) 500 MG tablet Take 1,000 mg by mouth 2 (two) times daily. 03/30/15  Yes [provider]  tamsulosin (FLOMAX) 0.4 MG CAPS capsule Take 0.4 mg by mouth daily.   Yes [provider]     Review of Systems  Positive ROS: As above  All other systems have been reviewed and were otherwise negative with the exception of those mentioned in the HPI and as above.  Objective: Vital signs in last 24 hours: Temp:  [98.1 F (36.7 C)] 98.1  F (36.7 C) (06/04 1122) Pulse Rate:  [110] 110 (06/04 1122) Resp:  [18] 18 (06/04 1122) BP: (140)/(78) 140/78 (06/04 1122) SpO2:  [93 %] 93 % (06/04 1122) Weight:  [70.3 kg] 70.3 kg (06/04 1122) Estimated body mass index is 25.79 kg/m as calculated from the following:   Height as of this encounter: 5\' 5"  (1.651 m).   Weight as of this encounter: 70.3 kg.   General Appearance: Alert Head: Normocephalic, without obvious abnormality, atraumatic Eyes: PERRL, conjunctiva/corneas clear, EOM's intact,    Ears: Normal  Throat: Normal  Neck: Supple, Back: unremarkable Lungs: Clear to auscultation  bilaterally, respirations unlabored Heart: Regular rate and rhythm, no murmur, rub or gallop Abdomen: Soft, non-tender Extremities: Extremities normal, atraumatic, no cyanosis or edema Skin: unremarkable  NEUROLOGIC:   Mental status: alert and oriented,Motor Exam -the patient has a complete left foot drop and weakness in his left gastrocnemius. Sensory Exam - grossly normal Reflexes:  Coordination - grossly normal Gait - grossly normal Balance - grossly normal Cranial Nerves: I: smell Not tested  II: visual acuity  OS: Normal  OD: Normal   II: visual fields Full to confrontation  II: pupils Equal, round, reactive to light  III,VII: ptosis None  III,IV,VI: extraocular muscles  Full ROM  V: mastication Normal  V: facial light touch sensation  Normal  V,VII: corneal reflex  Present  VII: facial muscle function - upper  Normal  VII: facial muscle function - lower Normal  VIII: hearing Not tested  IX: soft palate elevation  Normal  IX,X: gag reflex Present  XI: trapezius strength  5/5  XI: sternocleidomastoid strength 5/5  XI: neck flexion strength  5/5  XII: tongue strength  Normal    Data Review No results found for: "WBC", "HGB", "HCT", "MCV", "PLT" No results found for: "NA", "K", "CL", "CO2", "BUN", "CREATININE", "GLUCOSE" No results found for: "INR", "PROTIME"  Assessment/Plan: Lumbar spondylolisthesis, lumbar spinal stenosis, lumbago, lumbar radiculopathy, neurogenic claudication: I discussed situation with the patient.  I reviewed his imaging studies with him and pointed out the abnormalities.  We have discussed the various treatment options including surgery.  I described the surgical treatment option of an L4-5 decompression instrumentation fusion and right L5-S1 laminotomy.  I have shown him surgical models.  I have given him a surgical pamphlet.  We have discussed the risk, benefits, alternatives, expected postop course, and likelihood of achieving our goals with  surgery.  I have answered all the patient's questions.  He has decided to proceed with surgery.   Elder Greening 08/23/2023 12:15 PM

## 2023-08-23 NOTE — Progress Notes (Signed)
 Ann Bohne note: In the process of examining the patient I discovered a systolic murmur that does not appear to have every been noted prior, and thus has not been worked up. I discussed this the patient and with Dr. Larrie Po and I feel the patient should be evaluated prior to elective surgery given his age and lack of functional status as this could be hemodynamically significant aortic stenosis. I recommend getting him evaluated by cardiology for echocardiogram.

## 2023-08-29 ENCOUNTER — Telehealth: Payer: Self-pay

## 2023-08-29 NOTE — Telephone Encounter (Signed)
   Pre-operative Risk Assessment    Patient Name: Frank Zuniga  DOB: 1940/05/11 MRN: [id number]   Date of last office visit: N/A Date of next office visit: 09/19/2023   Request for Surgical Clearance    Procedure:  Lumbar Fusion   Date of Surgery:  Clearance TBD                                 Surgeon:  Dr. Garry [place] Surgeon's Group or Practice Name:  [facility] Phone number:  [phone] Fax number:  [phone]   Type of Clearance Requested:   - Medical  - Pharmacy:  Hold Not specified      Type of Anesthesia:  General    Additional requests/questions:  Please fax a copy of clearance status to the surgeon's office.  Signed, Riese Hellard M Mykel Mohl   08/29/2023, 3:37 PM

## 2023-08-30 NOTE — Telephone Encounter (Signed)
   Name: Frank Zuniga  DOB: [birth year]  MRN: [id number]  Primary Cardiologist: None  Chart reviewed as part of pre-operative protocol coverage. Because of [name] past medical history and time since last visit, he will require a follow-up in-office visit in order to better assess preoperative cardiovascular risk.  Patient has not yet established care with our practice. He is currently scheduled to establish care with Dr. Gordan Latina on 7/1 for preop evaluation. Need for preop evaluation is expressed in the appointment notes.   Debria Fang, PA-C  08/30/2023, 8:15 AM

## 2023-09-18 DIAGNOSIS — M48061 Spinal stenosis, lumbar region without neurogenic claudication: Secondary | ICD-10-CM | POA: Insufficient documentation

## 2023-09-18 DIAGNOSIS — M21379 Foot drop, unspecified foot: Secondary | ICD-10-CM | POA: Insufficient documentation

## 2023-09-19 ENCOUNTER — Ambulatory Visit: Attending: Cardiology | Admitting: Cardiology

## 2023-09-19 ENCOUNTER — Encounter: Payer: Self-pay | Admitting: Cardiology

## 2023-09-19 VITALS — BP 134/74 | HR 90 | Ht 65.0 in | Wt 154.6 lb

## 2023-09-19 DIAGNOSIS — C61 Malignant neoplasm of prostate: Secondary | ICD-10-CM

## 2023-09-19 DIAGNOSIS — Z01818 Encounter for other preprocedural examination: Secondary | ICD-10-CM

## 2023-09-19 DIAGNOSIS — M0579 Rheumatoid arthritis with rheumatoid factor of multiple sites without organ or systems involvement: Secondary | ICD-10-CM

## 2023-09-19 DIAGNOSIS — I10 Essential (primary) hypertension: Secondary | ICD-10-CM | POA: Diagnosis not present

## 2023-09-19 DIAGNOSIS — Z01810 Encounter for preprocedural cardiovascular examination: Secondary | ICD-10-CM | POA: Diagnosis not present

## 2023-09-19 DIAGNOSIS — R011 Cardiac murmur, unspecified: Secondary | ICD-10-CM | POA: Insufficient documentation

## 2023-09-19 MED ORDER — METOPROLOL TARTRATE 100 MG PO TABS: ORAL_TABLET | ORAL | 0 refills | Status: DC

## 2023-09-19 NOTE — Patient Instructions (Addendum)
 Medication Instructions:   TAKE: Metoprolol 100mg  1 tablet 2 hours prior to CT Scan   Lab Work: None Ordered If you have labs (blood work) drawn today and your tests are completely normal, you will receive your results only by: MyChart Message (if you have MyChart) OR A paper copy in the mail If you have any lab test that is abnormal or we need to change your treatment, we will call you to review the results.   Testing/Procedures:  Your physician has requested that you have an echocardiogram. Echocardiography is a painless test that uses sound waves to create images of your heart. It provides your doctor with information about the size and shape of your heart and how well your heart's chambers and valves are working. This procedure takes approximately one hour. There are no restrictions for this procedure. Please do NOT wear cologne, perfume, aftershave, or lotions (deodorant is allowed). Please arrive 15 minutes prior to your appointment time.  Please note: We ask at that you not bring children with you during ultrasound (echo/ vascular) testing. Due to room size and safety concerns, children are not allowed in the ultrasound rooms during exams. Our front office staff cannot provide observation of children in our lobby area while testing is being conducted. An adult accompanying a patient to their appointment will only be allowed in the ultrasound room at the discretion of the ultrasound technician under special circumstances. We apologize for any inconvenience.   Your cardiac CT will be scheduled at one of the below locations:   [facility] [address] [place], [place] [address] [phone]  Please follow these instructions carefully (unless otherwise directed):  Hold all erectile dysfunction medications at least 3 days (72 hrs) prior to test.  On the Night Before the Test: Be sure to Drink plenty of water. Do not consume any caffeinated/decaffeinated beverages or  chocolate 12 hours prior to your test. Do not take any antihistamines 12 hours prior to your test.  On the Day of the Test: Drink plenty of water until 1 hour prior to the test. Do not eat any food 4 hours prior to the test. You may take your regular medications prior to the test.  Take metoprolol (Lopressor) two hours prior to test. HOLD Furosemide/Hydrochlorothiazide morning of the test.    *For Clinical Staff only. Please instruct patient the following:* Heart Rate Medication Recommendations for Cardiac CT  Resting HR < 50 bpm  No medication  Resting HR 50-60 bpm and BP >110/50 mmHG   Consider Metoprolol tartrate 25 mg PO 90-120 min prior to scan  Resting HR 60-65 bpm and BP >110/50 mmHG  Metoprolol tartrate 50 mg PO 90-120 minutes prior to scan   Resting HR > 65 bpm and BP >110/50 mmHG  Metoprolol tartrate 100 mg PO 90-120 minutes prior to scan  Consider Ivabradine 10-15 mg PO or a calcium channel blocker for resting HR >60 bpm and contraindication to metoprolol tartrate  Consider Ivabradine 10-15 mg PO in combination with metoprolol tartrate for HR >80 bpm         After the Test: Drink plenty of water. After receiving IV contrast, you may experience a mild flushed feeling. This is normal. On occasion, you may experience a mild rash up to 24 hours after the test. This is not dangerous. If this occurs, you can take Benadryl 25 mg and increase your fluid intake. If you experience trouble breathing, this can be serious. If it is severe call 911 IMMEDIATELY. If  it is mild, please call our office. If you take any of these medications: Glipizide/Metformin, Avandament, Glucavance, please do not take 48 hours after completing test unless otherwise instructed.  We will call to schedule your test 2-4 weeks out understanding that some insurance companies will need an authorization prior to the service being performed.   For non-scheduling related questions, please contact the cardiac  imaging nurse navigator should you have any questions/concerns: Camie Shutter, Cardiac Imaging Nurse Navigator Chantal Requena, Cardiac Imaging Nurse Navigator [facility] Direct Office Dial: [phone]   For scheduling needs, including cancellations and rescheduling, please call [place], [phone].    Follow-Up: At [facility], you and your health needs are our priority.  As part of our continuing mission to provide you with exceptional heart care, we have created designated Provider Care Teams.  These Care Teams include your primary Cardiologist (physician) and Advanced Practice Providers (APPs -  Physician Assistants and Nurse Practitioners) who all work together to provide you with the care you need, when you need it.  We recommend signing up for the patient portal called MyChart.  Sign up information is provided on this After Visit Summary.  MyChart is used to connect with patients for Virtual Visits (Telemedicine).  Patients are able to view lab/test results, encounter notes, upcoming appointments, etc.  Non-urgent messages can be sent to your provider as well.   To learn more about what you can do with MyChart, go to [email or web address].    Your next appointment:   2 month(s)  The format for your next appointment:   In Person  Provider:   Lamar Fitch, MD    Other Instructions NA

## 2023-09-19 NOTE — Progress Notes (Signed)
 Cardiology Consultation:    Date:  09/19/2023   ID:  Frank Zuniga, DOB Aug 11, 1940, MRN [id number]  PCP:  Silver Lamar LABOR, MD  Cardiologist:  Lamar Fitch, MD   Referring MD: Silver Lamar LABOR, MD   Chief Complaint  Patient presents with   Clearance TBD    History of Present Illness:    Frank Zuniga is a 83 y.o. male who is being seen today for the evaluation of I need cardiovascular evaluation before my surgery at the request of Silver Lamar LABOR, MD. past medical history significant for dyslipidemia, family history of heart problem but not premature, prostate cancer rheumatoid arthritis essential hypertension.  About a month ago he was scheduled to have back surgery, however anesthesiologist listened to his heart heart heart murmur and canceled surgery he is here to talk about it.  Overall he said he never had any heart trouble except maybe 20 years ago he did have some palpitation that being in the hospital stress test being done which was negative he was discharged home.  His ability to exercise obviously is limited because of chronic back problem described to have fatigue tiredness shortness of breath some uneasy sensation in the chest when he walks.  Quit smoking many years ago.  Does have family history of heart problem but not premature.  Does not exercise on a regular basis, not on any special diet  Past Medical History:  Diagnosis Date   Arthritis    Cancer (HCC)    prostate   Family history of adverse reaction to anesthesia    Died with surgery; chest surgery   GERD (gastroesophageal reflux disease)    Hypertension     Past Surgical History:  Procedure Laterality Date   HERNIA REPAIR     x 3   PROSTATE SURGERY     TONSILLECTOMY      Current Medications: Current Meds  Medication Sig   aspirin 81 MG EC tablet Take 81 mg by mouth daily.   calcium carbonate (SUPER CALCIUM) 1500 (600 Ca) MG TABS tablet Take 600 mg of elemental calcium by mouth daily.   celecoxib  (CELEBREX) 200 MG capsule Take 200 mg by mouth 2 (two) times daily.   cetirizine (ZYRTEC) 10 MG tablet Take 10 mg by mouth daily.   Cholecalciferol 25 MCG (1000 UT) tablet Take 1,000 Units by mouth daily.   esomeprazole (NEXIUM) 40 MG capsule Take 40 mg by mouth daily.   folic acid (FOLVITE) 1 MG tablet Take 1 mg by mouth daily.   hydroxychloroquine (PLAQUENIL) 200 MG tablet Take 200 mg by mouth 2 (two) times daily.   Krill Oil 500 MG CAPS Take 500 mg by mouth daily.   methotrexate 50 MG/2ML injection Inject 25 mg into the muscle once a week.   metoprolol tartrate (LOPRESSOR) 100 MG tablet Take one tablet 2 hours before cardiac CT for heart greater than 55   Multiple Vitamins-Minerals (CENTRUM SILVER 50+MEN) TABS Take 1 tablet by mouth daily.   Niacin (VITAMIN B-3 PO) Take 1 tablet by mouth daily.   Olmesartan-amLODIPine-HCTZ (TRIBENZOR) 40-5-12.5 MG TABS Take 1 tablet by mouth daily.   predniSONE (DELTASONE) 5 MG tablet Take 5 mg by mouth daily.   sulfaSALAzine (AZULFIDINE) 500 MG tablet Take 1,000 mg by mouth 2 (two) times daily.   tamsulosin (FLOMAX) 0.4 MG CAPS capsule Take 0.4 mg by mouth daily.     Allergies:   Prevnar 13 [pneumococcal 13-val conj vacc]   Social History  Socioeconomic History   Marital status: Single    Spouse name: Not on file   Number of children: Not on file   Years of education: Not on file   Highest education level: Not on file  Occupational History   Not on file  Tobacco Use   Smoking status: Former    Current packs/day: 0.00    Types: Cigarettes    Quit date: 28    Years since quitting: 51.5   Smokeless tobacco: Never  Vaping Use   Vaping status: Never Used  Substance and Sexual Activity   Alcohol use: Never   Drug use: Never   Sexual activity: Not on file  Other Topics Concern   Not on file  Social History Narrative   Not on file   Social Drivers of Health   Financial Resource Strain: Not on file  Food Insecurity: Not on file   Transportation Needs: Not on file  Physical Activity: Not on file  Stress: Not on file  Social Connections: Not on file     Family History: The patient's family history is not on file. ROS:   Please see the history of present illness.    All 14 point review of systems negative except as described per history of present illness.  EKGs/Labs/Other Studies Reviewed:    The following studies were reviewed today:   EKG:  EKG Interpretation Date/Time:  Tuesday September 19 2023 15:38:31 EDT Ventricular Rate:  90 PR Interval:  164 QRS Duration:  96 QT Interval:  370 QTC Calculation: 452 R Axis:   -28  Text Interpretation: Sinus rhythm with Premature supraventricular complexes Otherwise normal ECG When compared with ECG of 06-Apr-2010 10:57, Premature supraventricular complexes are now Present QT has lengthened Confirmed by Bernie Charleston [phone]) on 09/19/2023 3:40:00 PM    Recent Labs: No results found for requested labs within last 365 days.  Recent Lipid Panel No results found for: CHOL, TRIG, HDL, CHOLHDL, VLDL, LDLCALC, LDLDIRECT  Physical Exam:    VS:  BP 134/74 (BP Location: Right Arm, Patient Position: Sitting)   Pulse 90   Ht 5' 5 (1.651 m)   Wt 154 lb 9.6 oz (70.1 kg)   SpO2 98%   BMI 25.73 kg/m     Wt Readings from Last 3 Encounters:  09/19/23 154 lb 9.6 oz (70.1 kg)  08/23/23 155 lb (70.3 kg)  08/10/23 154 lb 4.8 oz (70 kg)     GEN:  Well nourished, well developed in no acute distress HEENT: Normal NECK: No JVD; No carotid bruits LYMPHATICS: No lymphadenopathy CARDIAC: RRR, stock ejection murmur grade 1/6 to 2/6 best heard right upper portion of the sternum, holosystolic murmur grade 2 out of 6 best at left border of the sternum, no rubs, no gallops RESPIRATORY:  Clear to auscultation without rales, wheezing or rhonchi  ABDOMEN: Soft, non-tender, non-distended MUSCULOSKELETAL: Mild edema; No deformity  SKIN: Warm and dry NEUROLOGIC:  Alert and  oriented x 3 PSYCHIATRIC:  Normal affect   ASSESSMENT:    1. Essential hypertension   2. Pre-op evaluation   3. Heart murmur   4. Prostate cancer (HCC)   5. Rheumatoid arthritis involving multiple sites with positive rheumatoid factor (HCC)   6. Preop cardiovascular exam    PLAN:    In order of problems listed above:  Heart murmur I suspect he got aortic stenosis with some degree of mitral regurgitation and understanding any of this will be critical but need to be analyzed and verified by  doing echocardiogram which I will schedule him to have. Swelling of lower extremities which is mild he noticed this especially evening time, there is another argument for doing echocardiogram to assess left ventricle ejection fraction. Atypical chest pain with high risk for coronary artery disease.  I think it would be reasonable to perform coronary CT angio to make sure he does not have any significant obstructive disease. Dyslipidemia I will not initiate any therapy until I will have better understanding of his condition. Cardiovascular preop evaluation echocardiogram will be able to assess his left ventricular ejection fraction as well as to clarify and valvular pathology that is demonstrated on physical exam with heart murmur.  Her coronary CT angio will tell us  about his coronary atherosclerosis.   Medication Adjustments/Labs and Tests Ordered: Current medicines are reviewed at length with the patient today.  Concerns regarding medicines are outlined above.  Orders Placed This Encounter  Procedures   CT CORONARY MORPH W/CTA COR W/SCORE W/CA W/CM &/OR WO/CM   EKG 12-Lead   ECHOCARDIOGRAM COMPLETE   Meds ordered this encounter  Medications   metoprolol tartrate (LOPRESSOR) 100 MG tablet    Sig: Take one tablet 2 hours before cardiac CT for heart greater than 55    Dispense:  1 tablet    Refill:  0    Signed, Lamar DOROTHA Fitch, MD, [facility]. 09/19/2023 4:18 PM    [facility]

## 2023-10-05 ENCOUNTER — Ambulatory Visit: Attending: Cardiology

## 2023-10-05 DIAGNOSIS — Z01810 Encounter for preprocedural cardiovascular examination: Secondary | ICD-10-CM

## 2023-10-05 DIAGNOSIS — Z01818 Encounter for other preprocedural examination: Secondary | ICD-10-CM

## 2023-10-05 LAB — ECHOCARDIOGRAM COMPLETE
AR max vel: 0.97 cm2
AV Area VTI: 0.91 cm2
AV Area mean vel: 0.95 cm2
AV Mean grad: 12.8 mmHg
AV Peak grad: 28.7 mmHg
Ao pk vel: 2.68 m/s
Area-P 1/2: 2.16 cm2
MV VTI: 1.15 cm2
S' Lateral: 2.8 cm

## 2023-10-06 ENCOUNTER — Ambulatory Visit: Payer: Self-pay | Admitting: Cardiology

## 2023-10-06 ENCOUNTER — Telehealth: Payer: Self-pay

## 2023-10-06 NOTE — Telephone Encounter (Signed)
 Echo Results reviewed with pt as per Dr. Vanetta Shawl note.  Pt verbalized understanding and had no additional questions. Routed to PCP

## 2023-10-11 ENCOUNTER — Telehealth (HOSPITAL_COMMUNITY): Payer: Self-pay | Admitting: Emergency Medicine

## 2023-10-11 ENCOUNTER — Telehealth (HOSPITAL_COMMUNITY): Payer: Self-pay | Admitting: *Deleted

## 2023-10-11 NOTE — Telephone Encounter (Signed)
 Patient's wife returning call about patient's upcoming cardiac imaging study; pt's wife verbalizes understanding of appt date/time, parking situation and where to check in, pre-test NPO status and medications ordered, and verified current allergies; name and call back number provided for further questions should they arise  Chantal Requena RN Navigator Cardiac Imaging [facility] [phone] office [phone] cell  Patient to take 100mg  metoprolol  tartrate two hours prior to his cardiac CT scan.

## 2023-10-11 NOTE — Telephone Encounter (Signed)
 Attempted to call patient regarding upcoming cardiac CT appointment. Left message on voicemail with name and callback number Rockwell Alexandria RN Navigator Cardiac Imaging [facility] [phone] Office [phone] Cell

## 2023-10-12 ENCOUNTER — Ambulatory Visit (HOSPITAL_BASED_OUTPATIENT_CLINIC_OR_DEPARTMENT_OTHER)
Admission: RE | Admit: 2023-10-12 | Discharge: 2023-10-12 | Disposition: A | Discharge status: Home / Self Care | Source: Ambulatory Visit | Attending: Cardiology | Admitting: Cardiology

## 2023-10-12 DIAGNOSIS — Z01818 Encounter for other preprocedural examination: Secondary | ICD-10-CM | POA: Diagnosis not present

## 2023-10-12 DIAGNOSIS — R072 Precordial pain: Secondary | ICD-10-CM | POA: Diagnosis not present

## 2023-10-12 MED ORDER — METOPROLOL TARTRATE 5 MG/5ML IV SOLN
10.0000 mg | Freq: Once | INTRAVENOUS | Status: DC | PRN
Start: 2023-10-12 — End: 2023-10-13

## 2023-10-12 MED ORDER — DILTIAZEM HCL 25 MG/5ML IV SOLN: 10.0000 mg | INTRAVENOUS | Status: DC | PRN

## 2023-10-12 MED ORDER — NITROGLYCERIN 0.4 MG SL SUBL
0.8000 mg | SUBLINGUAL_TABLET | Freq: Once | SUBLINGUAL | Status: AC
  Administered 2023-10-12: 0.8 mg via SUBLINGUAL

## 2023-10-12 MED ORDER — IOHEXOL 350 MG/ML SOLN
95.0000 mL | Freq: Once | INTRAVENOUS | Status: AC | PRN
  Administered 2023-10-12: 95 mL via INTRAVENOUS

## 2023-10-16 ENCOUNTER — Other Ambulatory Visit: Payer: Self-pay | Admitting: Cardiology

## 2023-10-16 ENCOUNTER — Ambulatory Visit (HOSPITAL_BASED_OUTPATIENT_CLINIC_OR_DEPARTMENT_OTHER)
Admission: RE | Admit: 2023-10-16 | Discharge: 2023-10-16 | Disposition: A | Discharge status: Home / Self Care | Source: Ambulatory Visit | Attending: Cardiology | Admitting: Cardiology

## 2023-10-16 DIAGNOSIS — R931 Abnormal findings on diagnostic imaging of heart and coronary circulation: Secondary | ICD-10-CM

## 2023-10-16 NOTE — Progress Notes (Signed)
FFR Orders 

## 2023-10-19 ENCOUNTER — Ambulatory Visit: Payer: Self-pay | Admitting: Cardiology

## 2023-10-24 ENCOUNTER — Encounter: Payer: Self-pay | Admitting: Cardiology

## 2023-10-24 ENCOUNTER — Ambulatory Visit: Attending: Cardiology | Admitting: Cardiology

## 2023-10-24 VITALS — BP 142/68 | HR 71 | Ht 65.0 in | Wt 155.4 lb

## 2023-10-24 DIAGNOSIS — I350 Nonrheumatic aortic (valve) stenosis: Secondary | ICD-10-CM | POA: Insufficient documentation

## 2023-10-24 DIAGNOSIS — I2510 Atherosclerotic heart disease of native coronary artery without angina pectoris: Secondary | ICD-10-CM | POA: Diagnosis not present

## 2023-10-24 DIAGNOSIS — C61 Malignant neoplasm of prostate: Secondary | ICD-10-CM

## 2023-10-24 NOTE — Patient Instructions (Signed)
 Medication Instructions:   HOLD: Olmesartan-amLODIPine-HCTZ - Day of procedure     *If you need a refill on your cardiac medications before your next appointment, please call your pharmacy*   Lab Work: CBC, BMP -today  If you have labs (blood work) drawn today and your tests are completely normal, you will receive your results only by: MyChart Message (if you have MyChart) OR A paper copy in the mail If you have any lab test that is abnormal or we need to change your treatment, we will call you to review the results.   Testing/Procedures:  [facility] OF [place] [place] [address] [place] [address] Dept: [phone] Loc: [phone]  Frank Zuniga  10/24/2023   You are scheduled for a Cardiac Catheterization on Thursday, August 7 with Dr. Deatrice Cage.  1. Please arrive at the [facility] (Main Entrance A) at [facility]: [address] [place], [place] [address] at 10:00 AM (This time is 2 hour(s) before your procedure to ensure your preparation).   Free valet parking service is available. You will check in at ADMITTING. The support person will be asked to wait in the waiting room.  It is OK to have someone drop you off and come back when you are ready to be discharged.    Special note: Every effort is made to have your procedure done on time. Please understand that emergencies sometimes delay scheduled procedures.  2. Diet: No solid foods after midnight. You may have clear liquids until you arrive at the hospital.  List of approved liquids water , clear juice, clear tea, black coffee, fruit juices, non-citric and without pulp, carbonated beverages, Gatorade, Kool -Aid, plain Jello-O and plain ice popsicles.   3. Hydration: You need to be well hydrated before your procedure time. You may drink approved liquids (see below) until you arrive at the hospital. On the way to the hospital,  please drink a 16-oz (1 plastic bottle) of water .   List of approved liquids water , clear juice, clear tea, black coffee, fruit juices, non-citric and without pulp, carbonated beverages, Gatorade, Kool -Aid, plain Jello-O and plain ice popsicles.   4. Labs: You will need to have blood drawn on Tuesday, August 5 at [facility]: [address], [place] . You do not need to be fasting.  5. Medication instructions in preparation for your procedure:   Contrast Allergy: No     Stop taking, Benicar (Olmesartan) Thursday, August 7,, HTCZ (Hydrochlorothiazide) Thursday, August 7,      On the morning of your procedure, take your Aspirin  81 mg and any morning medicines NOT listed above.  You may use sips of water .  6. Plan to go home the same day, you will only stay overnight if medically necessary. 7. Bring a current list of your medications and current insurance cards. 8. You MUST have a responsible person to drive you home. 9. Someone MUST be with you the first 24 hours after you arrive home or your discharge will be delayed. 10. Please wear clothes that are easy to get on and off and wear slip-on shoes.  Thank you for allowing us  to care for you!   -- [facility]    Follow-Up: At [facility], you and your health needs are our priority.  As part of our continuing mission to provide you with exceptional heart care, we have created designated Provider Care Teams.  These Care Teams include your primary Cardiologist (physician) and Advanced Practice Providers (APPs -  Physician Assistants and Nurse Practitioners) who all work together to provide you with the care you need, when you need it.  We recommend signing up for the patient portal called MyChart.  Sign up information is provided on this After Visit Summary.  MyChart is used to connect with patients for Virtual Visits (Telemedicine).  Patients are able to view lab/test results, encounter notes,  upcoming appointments, etc.  Non-urgent messages can be sent to your provider as well.   To learn more about what you can do with MyChart, go to [email or web address].    Your next appointment:   3 month(s)  The format for your next appointment:   In Person  Provider:   Lamar Fitch, MD   Other Instructions  Coronary Angiogram With Stent Coronary angiogram with stent placement is a procedure to widen or open a narrow blood vessel of the heart (coronary artery). Arteries may become blocked by cholesterol buildup (plaques) in the lining of the artery wall. When a coronary artery becomes partially blocked, blood flow to that area decreases. This may lead to chest pain or a heart attack (myocardial infarction). A stent is a small piece of metal that looks like mesh or spring. Stent placement may be done as treatment after a heart attack, or to prevent a heart attack if a blocked artery is found by a coronary angiogram. Let your health care provider know about: Any allergies you have, including allergies to medicines or contrast dye. All medicines you are taking, including vitamins, herbs, eye drops, creams, and over-the-counter medicines. Any problems you or family members have had with anesthetic medicines. Any blood disorders you have. Any surgeries you have had. Any medical conditions you have, including kidney problems or kidney failure. Whether you are pregnant or may be pregnant. Whether you are breastfeeding. What are the risks? Generally, this is a safe procedure. However, serious problems may occur, including: Damage to nearby structures or organs, such as the heart, blood vessels, or kidneys. A return of blockage. Bleeding, infection, or bruising at the insertion site. A collection of blood under the skin (hematoma) at the insertion site. A blood clot in another part of the body. Allergic reaction to medicines or dyes. Bleeding into the abdomen (retroperitoneal  bleeding). Stroke (rare). Heart attack (rare). What happens before the procedure? Staying hydrated Follow instructions from your health care provider about hydration, which may include: Up to 2 hours before the procedure - you may continue to drink clear liquids, such as water , clear fruit juice, black coffee, and plain tea.    Eating and drinking restrictions Follow instructions from your health care provider about eating and drinking, which may include: 8 hours before the procedure - stop eating heavy meals or foods, such as meat, fried foods, or fatty foods. 6 hours before the procedure - stop eating light meals or foods, such as toast or cereal. 2 hours before the procedure - stop drinking clear liquids. Medicines Ask your health care provider about: Changing or stopping your regular medicines. This is especially important if you are taking diabetes medicines or blood thinners. Taking medicines such as aspirin  and ibuprofen. These medicines can thin your blood. Do not take these medicines unless your health care provider tells you to take them. Generally, aspirin  is recommended before a thin tube, called a catheter, is passed through a blood vessel and inserted into the heart (cardiac catheterization). Taking over-the-counter medicines,  vitamins, herbs, and supplements. General instructions Do not use any products that contain nicotine or tobacco for at least 4 weeks before the procedure. These products include cigarettes, e-cigarettes, and chewing tobacco. If you need help quitting, ask your health care provider. Plan to have someone take you home from the hospital or clinic. If you will be going home right after the procedure, plan to have someone with you for 24 hours. You may have tests and imaging procedures. Ask your health care provider: How your insertion site will be marked. Ask which artery will be used for the procedure. What steps will be taken to help prevent infection.  These may include: Removing hair at the insertion site. Washing skin with a germ-killing soap. Taking antibiotic medicine. What happens during the procedure? An IV will be inserted into one of your veins. Electrodes may be placed on your chest to monitor your heart rate during the procedure. You will be given one or more of the following: A medicine to help you relax (sedative). A medicine to numb the area (local anesthetic) for catheter insertion. A small incision will be made for catheter insertion. The catheter will be inserted into an artery using a guide wire. The location may be in your groin, your wrist, or the fold of your arm (near your elbow). An X-ray procedure (fluoroscopy) will be used to help guide the catheter to the opening of the heart arteries. A dye will be injected into the catheter. X-rays will be taken. The dye helps to show where any narrowing or blockages are located in the arteries. Tell your health care provider if you have chest pain or trouble breathing. A tiny wire will be guided to the blocked spot, and a balloon will be inflated to make the artery wider. The stent will be expanded to crush the plaques into the wall of the vessel. The stent will hold the area open and improve the blood flow. Most stents have a drug coating to reduce the risk of the stent narrowing over time. The artery may be made wider using a drill, laser, or other tools that remove plaques. The catheter will be removed when the blood flow improves. The stent will stay where it was placed, and the lining of the artery will grow over it. A bandage (dressing) will be placed on the insertion site. Pressure will be applied to stop bleeding. The IV will be removed. This procedure may vary among health care providers and hospitals.    What happens after the procedure? Your blood pressure, heart rate, breathing rate, and blood oxygen level will be monitored until you leave the hospital or clinic. If  the procedure is done through the leg, you will lie flat in bed for a few hours or for as long as told by your health care provider. You will be instructed not to bend or cross your legs. The insertion site and the pulse in your foot or wrist will be checked often. You may have more blood tests, X-rays, and a test that records the electrical activity of your heart (electrocardiogram, or ECG). Do not drive for 24 hours if you were given a sedative during your procedure. Summary Coronary angiogram with stent placement is a procedure to widen or open a narrowed coronary artery. This is done to treat heart problems. Before the procedure, let your health care provider know about all the medical conditions and surgeries you have or have had. This is a safe procedure. However, some problems may  occur, including damage to nearby structures or organs, bleeding, blood clots, or allergies. Follow your health care provider's instructions about eating, drinking, medicines, and other lifestyle changes, such as quitting tobacco use before the procedure. This information is not intended to replace advice given to you by your health care provider. Make sure you discuss any questions you have with your health care provider. Document Revised: 09/26/2018 Document Reviewed: 09/26/2018 Elsevier Patient Education  2021 [employer].

## 2023-10-24 NOTE — H&P (View-Only) (Signed)
 Cardiology Office Note:    Date:  10/24/2023   ID:  Frank Zuniga, DOB September 03, 1940, MRN [id number]  PCP:  Silver Lamar LABOR, MD  Cardiologist:  Lamar Fitch, MD    Referring MD: Silver Lamar LABOR, MD   No chief complaint on file.   History of Present Illness:    Frank Zuniga is a 83 y.o. male past medical history significant for prostate cancer, rheumatoid arthritis, dyslipidemia, family history of coronary disease but not premature.  He was sent to us  because he went to be evaluated anesthesia before elective back surgery he was fine to have heart murmur he was referred to us  echocardiogram has been done echocardiogram shows moderate aortic stenosis with calculated aortic valve area of 0.97, dimensionless index is 0.26, mean gradient only 12.8, stroke-volume index 32 which is mildly diminished, he also got coronary CT angio performed coronary CT angio showed calcium score of 486 however it appears to be severe stenosis in the midportion of the large obtuse marginal branch, Of the LAD appears to be heavily calcified with distal FFR of 0.79 but analysis did not show any discrete lesion.  He is ability to exercise limited.  That is because of back.  He walks around with a walker.  He denies have any chest pain tightness squeezing pressure burning chest  Past Medical History:  Diagnosis Date   Arthritis    Cancer [facility])    prostate   Family history of adverse reaction to anesthesia    Died with surgery; chest surgery   GERD (gastroesophageal reflux disease)    Hypertension     Past Surgical History:  Procedure Laterality Date   HERNIA REPAIR     x 3   PROSTATE SURGERY     TONSILLECTOMY      Current Medications: Current Meds  Medication Sig   aspirin  81 MG EC tablet Take 81 mg by mouth daily.   calcium carbonate (SUPER CALCIUM) 1500 (600 Ca) MG TABS tablet Take 600 mg of elemental calcium by mouth daily.   celecoxib (CELEBREX) 200 MG capsule Take 200 mg by mouth 2 (two) times  daily.   cetirizine (ZYRTEC) 10 MG tablet Take 10 mg by mouth daily.   Cholecalciferol 25 MCG (1000 UT) tablet Take 1,000 Units by mouth daily.   esomeprazole (NEXIUM) 40 MG capsule Take 40 mg by mouth daily.   folic acid (FOLVITE) 1 MG tablet Take 1 mg by mouth daily.   hydroxychloroquine (PLAQUENIL) 200 MG tablet Take 200 mg by mouth 2 (two) times daily.   Krill Oil 500 MG CAPS Take 500 mg by mouth daily.   methotrexate 50 MG/2ML injection Inject 25 mg into the muscle once a week.   Multiple Vitamins-Minerals (CENTRUM SILVER 50+MEN) TABS Take 1 tablet by mouth daily.   Niacin (VITAMIN B-3 PO) Take 1 tablet by mouth daily.   Olmesartan-amLODIPine-HCTZ (TRIBENZOR) 40-5-12.5 MG TABS Take 1 tablet by mouth daily.   predniSONE (DELTASONE) 5 MG tablet Take 5 mg by mouth daily.   sulfaSALAzine (AZULFIDINE) 500 MG tablet Take 1,000 mg by mouth 2 (two) times daily.   tamsulosin (FLOMAX) 0.4 MG CAPS capsule Take 0.4 mg by mouth daily.     Allergies:   Prevnar 13 [pneumococcal 13-val conj vacc]   Social History   Socioeconomic History   Marital status: Single    Spouse name: Not on file   Number of children: Not on file   Years of education: Not on file   Highest  education level: Not on file  Occupational History   Not on file  Tobacco Use   Smoking status: Former    Current packs/day: 0.00    Types: Cigarettes    Quit date: 11    Years since quitting: 51.6   Smokeless tobacco: Never  Vaping Use   Vaping status: Never Used  Substance and Sexual Activity   Alcohol use: Never   Drug use: Never   Sexual activity: Not on file  Other Topics Concern   Not on file  Social History Narrative   Not on file   Social Drivers of Health   Financial Resource Strain: Not on file  Food Insecurity: Not on file  Transportation Needs: Not on file  Physical Activity: Not on file  Stress: Not on file  Social Connections: Not on file     Family History: The patient's family history is not  on file. ROS:   Please see the history of present illness.    All 14 point review of systems negative except as described per history of present illness  EKGs/Labs/Other Studies Reviewed:         Recent Labs: No results found for requested labs within last 365 days.  Recent Lipid Panel No results found for: CHOL, TRIG, HDL, CHOLHDL, VLDL, LDLCALC, LDLDIRECT  Physical Exam:    VS:  BP (!) 142/68   Pulse 71   Ht 5' 5 (1.651 m)   Wt 155 lb 6.4 oz (70.5 kg)   SpO2 94%   BMI 25.86 kg/m     Wt Readings from Last 3 Encounters:  10/24/23 155 lb 6.4 oz (70.5 kg)  09/19/23 154 lb 9.6 oz (70.1 kg)  08/23/23 155 lb (70.3 kg)     GEN:  Well nourished, well developed in no acute distress HEENT: Normal NECK: No JVD; No carotid bruits LYMPHATICS: No lymphadenopathy CARDIAC: RRR, systolic ejection murmur grade 3/6 basal right upper portion of the sternum S2 is soft but present, no rubs, no gallops RESPIRATORY:  Clear to auscultation without rales, wheezing or rhonchi  ABDOMEN: Soft, non-tender, non-distended MUSCULOSKELETAL:  No edema; No deformity  SKIN: Warm and dry LOWER EXTREMITIES: no swelling NEUROLOGIC:  Alert and oriented x 3 PSYCHIATRIC:  Normal affect   ASSESSMENT:    1. Nonrheumatic aortic valve stenosis   2. Coronary artery disease involving native coronary artery of native heart without angina pectoris   3. Prostate cancer [facility])    PLAN:    In order of problems listed above:  Cardiovascular preop evaluation before elective back surgery.  We had a long discussion about it we have a few questions first of all degree of aortic stenosis.  Cardiac related aortic valve area is less than 1 cm, dimensional index 0.26 its kind of borderline, mean gradient only in the mid stenosis criteria some conflicting information from the study.  I think it needs to be clarified by doing cardiac catheterization, on top of that we do have a severely stenosed midportion of  the large obtuse marginal branch, LAD have low FFR distally from FFR analysis look like there is no discrete stenosis but recently we had few situation when the artery was heavily calcified and there was presence of severe significant stenosis therefore I think cardiac catheterization will be the best way to evaluate this I explained my concern here to him he agreed to proceed.  Cardiac catheterization explained to him including all risk benefits as well as alternatives.  He agreed to proceed. Dyslipidemia  I did review K PN however the data I have is from 2018 then I was able to find a fasting lipid profile done by primary care physician from 2021 when LDL was 117 HDL 48.  Will make arrangements for his cholesterol be rechecked. Prostate cancer stable.   Medication Adjustments/Labs and Tests Ordered: Current medicines are reviewed at length with the patient today.  Concerns regarding medicines are outlined above.  No orders of the defined types were placed in this encounter.  Medication changes: No orders of the defined types were placed in this encounter.   Signed, Lamar DOROTHA Fitch, MD, [facility] 10/24/2023 2:31 PM    [facility]

## 2023-10-24 NOTE — Progress Notes (Unsigned)
 Cardiology Office Note:    Date:  10/24/2023   ID:  Frank Zuniga, DOB September 03, 1940, MRN [id number]  PCP:  Silver Lamar LABOR, MD  Cardiologist:  Lamar Fitch, MD    Referring MD: Silver Lamar LABOR, MD   No chief complaint on file.   History of Present Illness:    Frank Zuniga is a 83 y.o. male past medical history significant for prostate cancer, rheumatoid arthritis, dyslipidemia, family history of coronary disease but not premature.  He was sent to us  because he went to be evaluated anesthesia before elective back surgery he was fine to have heart murmur he was referred to us  echocardiogram has been done echocardiogram shows moderate aortic stenosis with calculated aortic valve area of 0.97, dimensionless index is 0.26, mean gradient only 12.8, stroke-volume index 32 which is mildly diminished, he also got coronary CT angio performed coronary CT angio showed calcium score of 486 however it appears to be severe stenosis in the midportion of the large obtuse marginal branch, Of the LAD appears to be heavily calcified with distal FFR of 0.79 but analysis did not show any discrete lesion.  He is ability to exercise limited.  That is because of back.  He walks around with a walker.  He denies have any chest pain tightness squeezing pressure burning chest  Past Medical History:  Diagnosis Date   Arthritis    Cancer [facility])    prostate   Family history of adverse reaction to anesthesia    Died with surgery; chest surgery   GERD (gastroesophageal reflux disease)    Hypertension     Past Surgical History:  Procedure Laterality Date   HERNIA REPAIR     x 3   PROSTATE SURGERY     TONSILLECTOMY      Current Medications: Current Meds  Medication Sig   aspirin  81 MG EC tablet Take 81 mg by mouth daily.   calcium carbonate (SUPER CALCIUM) 1500 (600 Ca) MG TABS tablet Take 600 mg of elemental calcium by mouth daily.   celecoxib (CELEBREX) 200 MG capsule Take 200 mg by mouth 2 (two) times  daily.   cetirizine (ZYRTEC) 10 MG tablet Take 10 mg by mouth daily.   Cholecalciferol 25 MCG (1000 UT) tablet Take 1,000 Units by mouth daily.   esomeprazole (NEXIUM) 40 MG capsule Take 40 mg by mouth daily.   folic acid (FOLVITE) 1 MG tablet Take 1 mg by mouth daily.   hydroxychloroquine (PLAQUENIL) 200 MG tablet Take 200 mg by mouth 2 (two) times daily.   Krill Oil 500 MG CAPS Take 500 mg by mouth daily.   methotrexate 50 MG/2ML injection Inject 25 mg into the muscle once a week.   Multiple Vitamins-Minerals (CENTRUM SILVER 50+MEN) TABS Take 1 tablet by mouth daily.   Niacin (VITAMIN B-3 PO) Take 1 tablet by mouth daily.   Olmesartan-amLODIPine-HCTZ (TRIBENZOR) 40-5-12.5 MG TABS Take 1 tablet by mouth daily.   predniSONE (DELTASONE) 5 MG tablet Take 5 mg by mouth daily.   sulfaSALAzine (AZULFIDINE) 500 MG tablet Take 1,000 mg by mouth 2 (two) times daily.   tamsulosin (FLOMAX) 0.4 MG CAPS capsule Take 0.4 mg by mouth daily.     Allergies:   Prevnar 13 [pneumococcal 13-val conj vacc]   Social History   Socioeconomic History   Marital status: Single    Spouse name: Not on file   Number of children: Not on file   Years of education: Not on file   Highest  education level: Not on file  Occupational History   Not on file  Tobacco Use   Smoking status: Former    Current packs/day: 0.00    Types: Cigarettes    Quit date: 11    Years since quitting: 51.6   Smokeless tobacco: Never  Vaping Use   Vaping status: Never Used  Substance and Sexual Activity   Alcohol use: Never   Drug use: Never   Sexual activity: Not on file  Other Topics Concern   Not on file  Social History Narrative   Not on file   Social Drivers of Health   Financial Resource Strain: Not on file  Food Insecurity: Not on file  Transportation Needs: Not on file  Physical Activity: Not on file  Stress: Not on file  Social Connections: Not on file     Family History: The patient's family history is not  on file. ROS:   Please see the history of present illness.    All 14 point review of systems negative except as described per history of present illness  EKGs/Labs/Other Studies Reviewed:         Recent Labs: No results found for requested labs within last 365 days.  Recent Lipid Panel No results found for: CHOL, TRIG, HDL, CHOLHDL, VLDL, LDLCALC, LDLDIRECT  Physical Exam:    VS:  BP (!) 142/68   Pulse 71   Ht 5' 5 (1.651 m)   Wt 155 lb 6.4 oz (70.5 kg)   SpO2 94%   BMI 25.86 kg/m     Wt Readings from Last 3 Encounters:  10/24/23 155 lb 6.4 oz (70.5 kg)  09/19/23 154 lb 9.6 oz (70.1 kg)  08/23/23 155 lb (70.3 kg)     GEN:  Well nourished, well developed in no acute distress HEENT: Normal NECK: No JVD; No carotid bruits LYMPHATICS: No lymphadenopathy CARDIAC: RRR, systolic ejection murmur grade 3/6 basal right upper portion of the sternum S2 is soft but present, no rubs, no gallops RESPIRATORY:  Clear to auscultation without rales, wheezing or rhonchi  ABDOMEN: Soft, non-tender, non-distended MUSCULOSKELETAL:  No edema; No deformity  SKIN: Warm and dry LOWER EXTREMITIES: no swelling NEUROLOGIC:  Alert and oriented x 3 PSYCHIATRIC:  Normal affect   ASSESSMENT:    1. Nonrheumatic aortic valve stenosis   2. Coronary artery disease involving native coronary artery of native heart without angina pectoris   3. Prostate cancer [facility])    PLAN:    In order of problems listed above:  Cardiovascular preop evaluation before elective back surgery.  We had a long discussion about it we have a few questions first of all degree of aortic stenosis.  Cardiac related aortic valve area is less than 1 cm, dimensional index 0.26 its kind of borderline, mean gradient only in the mid stenosis criteria some conflicting information from the study.  I think it needs to be clarified by doing cardiac catheterization, on top of that we do have a severely stenosed midportion of  the large obtuse marginal branch, LAD have low FFR distally from FFR analysis look like there is no discrete stenosis but recently we had few situation when the artery was heavily calcified and there was presence of severe significant stenosis therefore I think cardiac catheterization will be the best way to evaluate this I explained my concern here to him he agreed to proceed.  Cardiac catheterization explained to him including all risk benefits as well as alternatives.  He agreed to proceed. Dyslipidemia  I did review K PN however the data I have is from 2018 then I was able to find a fasting lipid profile done by primary care physician from 2021 when LDL was 117 HDL 48.  Will make arrangements for his cholesterol be rechecked. Prostate cancer stable.   Medication Adjustments/Labs and Tests Ordered: Current medicines are reviewed at length with the patient today.  Concerns regarding medicines are outlined above.  No orders of the defined types were placed in this encounter.  Medication changes: No orders of the defined types were placed in this encounter.   Signed, Lamar DOROTHA Fitch, MD, [facility] 10/24/2023 2:31 PM    [facility]

## 2023-10-25 ENCOUNTER — Telehealth: Payer: Self-pay | Admitting: *Deleted

## 2023-10-25 LAB — BASIC METABOLIC PANEL WITH GFR
BUN/Creatinine Ratio: 16 (ref 10–24)
BUN: 15 mg/dL (ref 8–27)
CO2: 25 mmol/L (ref 20–29)
Calcium: 10.1 mg/dL (ref 8.6–10.2)
Chloride: 101 mmol/L (ref 96–106)
Creatinine, Ser: 0.96 mg/dL (ref 0.76–1.27)
Glucose: 100 mg/dL — ABNORMAL HIGH (ref 70–99)
Potassium: 4.1 mmol/L (ref 3.5–5.2)
Sodium: 141 mmol/L (ref 134–144)
eGFR: 78 mL/min/1.73 (ref >59)

## 2023-10-25 LAB — CBC
Hematocrit: 44.6 % (ref 37.5–51.0)
Hemoglobin: 15.1 g/dL (ref 13.0–17.7)
MCH: 35.7 pg — ABNORMAL HIGH (ref 26.6–33.0)
MCHC: 33.9 g/dL (ref 31.5–35.7)
MCV: 105 fL — ABNORMAL HIGH (ref 79–97)
Platelets: 127 x10E3/uL — ABNORMAL LOW (ref 150–450)
RBC: 4.23 x10E6/uL (ref 4.14–5.80)
RDW: 13.6 % (ref 11.6–15.4)
WBC: 5.5 x10E3/uL (ref 3.4–10.8)

## 2023-10-25 NOTE — Telephone Encounter (Signed)
Reviewed procedure instructions with patient's wife (DPR).

## 2023-10-25 NOTE — Telephone Encounter (Signed)
 Cardiac Catheterization scheduled at [facility] for: Thursday October 26, 2023 12 Noon Arrival time [facility] Main Entrance A at: 10 AM  Diet: -May have light meal until 6 AM. (6 hours before procedure time) Approved light meal consists of plain toast, fruit, light soups, crackers.   Hydration: -May drink clear liquids until leaving for hospital. Approved liquids: Water , clear tea, black coffee, fruit juices-non-citric and without pulp,Gatorade, plain Jello/popsicles.  Drink 16 oz. bottle of water  on the way to the hospital.  Medication instructions: -Hold:  Tribenzor-AM of procedure -Other usual morning medications can be taken including aspirin  81 mg.  Plan to go home the same day, you will only stay overnight if medically necessary.  You must have responsible adult to drive you home.  Someone must be with you the first 24 hours after you arrive home.  Left message for patient to call back to review procedure instructions.

## 2023-10-25 NOTE — Telephone Encounter (Signed)
 Pt requesting a c/b to go over procedure instructions and ask questions.

## 2023-10-26 ENCOUNTER — Ambulatory Visit (HOSPITAL_COMMUNITY)
Admission: RE | Admit: 2023-10-26 | Discharge: 2023-10-26 | Disposition: A | Discharge status: Home / Self Care | Attending: Cardiovascular Disease | Admitting: Cardiovascular Disease

## 2023-10-26 ENCOUNTER — Encounter (HOSPITAL_COMMUNITY): Payer: Self-pay | Admitting: Cardiovascular Disease

## 2023-10-26 ENCOUNTER — Encounter (HOSPITAL_COMMUNITY)
Admission: RE | Disposition: A | Discharge status: Home / Self Care | Payer: Self-pay | Source: Home / Self Care | Attending: Cardiovascular Disease

## 2023-10-26 ENCOUNTER — Other Ambulatory Visit: Payer: Self-pay

## 2023-10-26 DIAGNOSIS — Z79899 Other long term (current) drug therapy: Secondary | ICD-10-CM | POA: Diagnosis not present

## 2023-10-26 DIAGNOSIS — E785 Hyperlipidemia, unspecified: Secondary | ICD-10-CM | POA: Diagnosis not present

## 2023-10-26 DIAGNOSIS — I2584 Coronary atherosclerosis due to calcified coronary lesion: Secondary | ICD-10-CM | POA: Diagnosis not present

## 2023-10-26 DIAGNOSIS — Z8249 Family history of ischemic heart disease and other diseases of the circulatory system: Secondary | ICD-10-CM | POA: Insufficient documentation

## 2023-10-26 DIAGNOSIS — I2510 Atherosclerotic heart disease of native coronary artery without angina pectoris: Secondary | ICD-10-CM | POA: Insufficient documentation

## 2023-10-26 DIAGNOSIS — I350 Nonrheumatic aortic (valve) stenosis: Secondary | ICD-10-CM | POA: Insufficient documentation

## 2023-10-26 DIAGNOSIS — Z87891 Personal history of nicotine dependence: Secondary | ICD-10-CM | POA: Insufficient documentation

## 2023-10-26 DIAGNOSIS — M069 Rheumatoid arthritis, unspecified: Secondary | ICD-10-CM | POA: Insufficient documentation

## 2023-10-26 DIAGNOSIS — Z8546 Personal history of malignant neoplasm of prostate: Secondary | ICD-10-CM | POA: Diagnosis not present

## 2023-10-26 HISTORY — PX: RIGHT/LEFT HEART CATH AND CORONARY ANGIOGRAPHY: CATH118266

## 2023-10-26 LAB — POCT I-STAT EG7
Acid-Base Excess: 0 mmol/L (ref 0.0–2.0)
Acid-Base Excess: 1 mmol/L (ref 0.0–2.0)
Acid-Base Excess: 1 mmol/L (ref 0.0–2.0)
Bicarbonate: 25.3 mmol/L (ref 20.0–28.0)
Bicarbonate: 26.2 mmol/L (ref 20.0–28.0)
Bicarbonate: 26.7 mmol/L (ref 20.0–28.0)
Calcium, Ion: 1.26 mmol/L (ref 1.15–1.40)
Calcium, Ion: 1.3 mmol/L (ref 1.15–1.40)
Calcium, Ion: 1.31 mmol/L (ref 1.15–1.40)
HCT: 40 % (ref 39.0–52.0)
HCT: 41 % (ref 39.0–52.0)
HCT: 41 % (ref 39.0–52.0)
Hemoglobin: 13.6 g/dL (ref 13.0–17.0)
Hemoglobin: 13.9 g/dL (ref 13.0–17.0)
Hemoglobin: 13.9 g/dL (ref 13.0–17.0)
O2 Saturation: 80 %
O2 Saturation: 80 %
O2 Saturation: 98 %
Potassium: 3.4 mmol/L — ABNORMAL LOW (ref 3.5–5.1)
Potassium: 3.5 mmol/L (ref 3.5–5.1)
Potassium: 3.5 mmol/L (ref 3.5–5.1)
Sodium: 141 mmol/L (ref 135–145)
Sodium: 142 mmol/L (ref 135–145)
Sodium: 142 mmol/L (ref 135–145)
TCO2: 27 mmol/L (ref 22–32)
TCO2: 27 mmol/L (ref 22–32)
TCO2: 28 mmol/L (ref 22–32)
pCO2, Ven: 44.4 mmHg (ref 44–60)
pCO2, Ven: 44.4 mmHg (ref 44–60)
pCO2, Ven: 46.5 mmHg (ref 44–60)
pH, Ven: 7.364 (ref 7.25–7.43)
pH, Ven: 7.366 (ref 7.25–7.43)
pH, Ven: 7.378 (ref 7.25–7.43)
pO2, Ven: 102 mmHg — ABNORMAL HIGH (ref 32–45)
pO2, Ven: 46 mmHg — ABNORMAL HIGH (ref 32–45)
pO2, Ven: 47 mmHg — ABNORMAL HIGH (ref 32–45)

## 2023-10-26 SURGERY — RIGHT/LEFT HEART CATH AND CORONARY ANGIOGRAPHY
Anesthesia: LOCAL

## 2023-10-26 MED ORDER — VERAPAMIL HCL 2.5 MG/ML IV SOLN
INTRAVENOUS | Status: DC | PRN
  Administered 2023-10-26: 10 mL via INTRA_ARTERIAL

## 2023-10-26 MED ORDER — FENTANYL CITRATE (PF) 100 MCG/2ML IJ SOLN
INTRAMUSCULAR | Status: DC | PRN
  Administered 2023-10-26: 25 ug via INTRAVENOUS

## 2023-10-26 MED ORDER — IOHEXOL 350 MG/ML SOLN
INTRAVENOUS | Status: DC | PRN
  Administered 2023-10-26: 40 mL

## 2023-10-26 MED ORDER — MIDAZOLAM HCL 2 MG/2ML IJ SOLN
INTRAMUSCULAR | Status: DC | PRN
  Administered 2023-10-26: 1 mg via INTRAVENOUS

## 2023-10-26 MED ORDER — SODIUM CHLORIDE 0.9 % IV SOLN: 250.0000 mL | INTRAVENOUS | Status: DC | PRN

## 2023-10-26 MED ORDER — HEPARIN SODIUM (PORCINE) 1000 UNIT/ML IJ SOLN
INTRAMUSCULAR | Status: DC | PRN
  Administered 2023-10-26: 3000 [IU] via INTRAVENOUS

## 2023-10-26 MED ORDER — ASPIRIN 81 MG PO CHEW: 81.0000 mg | CHEWABLE_TABLET | ORAL | Status: DC

## 2023-10-26 MED ORDER — SODIUM CHLORIDE 0.9% FLUSH: 3.0000 mL | INTRAVENOUS | Status: DC | PRN

## 2023-10-26 MED ORDER — MIDAZOLAM HCL 2 MG/2ML IJ SOLN
INTRAMUSCULAR | Status: AC
  Filled 2023-10-26: qty 2

## 2023-10-26 MED ORDER — VERAPAMIL HCL 2.5 MG/ML IV SOLN
INTRAVENOUS | Status: AC
  Filled 2023-10-26: qty 2

## 2023-10-26 MED ORDER — FREE WATER
500.0000 mL | Freq: Once | Status: AC
  Administered 2023-10-26: 500 mL via ORAL

## 2023-10-26 MED ORDER — HEPARIN (PORCINE) IN NACL 1000-0.9 UT/500ML-% IV SOLN
INTRAVENOUS | Status: DC | PRN
  Administered 2023-10-26: 1000 mL

## 2023-10-26 MED ORDER — LIDOCAINE HCL (PF) 1 % IJ SOLN
INTRAMUSCULAR | Status: AC
  Filled 2023-10-26: qty 30

## 2023-10-26 MED ORDER — SODIUM CHLORIDE 0.9% FLUSH: 3.0000 mL | Freq: Two times a day (BID) | INTRAVENOUS | Status: DC

## 2023-10-26 MED ORDER — LIDOCAINE HCL (PF) 1 % IJ SOLN
INTRAMUSCULAR | Status: DC | PRN
  Administered 2023-10-26 (×2): 2 mL

## 2023-10-26 MED ORDER — FENTANYL CITRATE (PF) 100 MCG/2ML IJ SOLN
INTRAMUSCULAR | Status: AC
  Filled 2023-10-26: qty 2

## 2023-10-26 SURGICAL SUPPLY — 12 items
CATH BALLN WEDGE 5F 110CM (CATHETERS) IMPLANT
CATH INFINITI AMBI 5FR JK (CATHETERS) IMPLANT
CATH INFINITI JR4 5F (CATHETERS) IMPLANT
DEVICE RAD COMP TR BAND LRG (VASCULAR PRODUCTS) IMPLANT
GLIDESHEATH SLEND SS 6F .021 (SHEATH) IMPLANT
GUIDEWIRE .025 260CM (WIRE) IMPLANT
GUIDEWIRE INQWIRE 1.5J.035X260 (WIRE) IMPLANT
KIT SINGLE USE MANIFOLD (KITS) IMPLANT
PACK CARDIAC CATHETERIZATION (CUSTOM PROCEDURE TRAY) ×1 IMPLANT
SET ATX-X65L (MISCELLANEOUS) IMPLANT
SHEATH GLIDE SLENDER 4/5FR (SHEATH) IMPLANT
WIRE MICROINTRODUCER 60CM (WIRE) IMPLANT

## 2023-10-26 NOTE — Interval H&P Note (Signed)
 History and Physical Interval Note:  10/26/2023 1:04 PM  Frank Zuniga  has presented today for surgery, with the diagnosis of abnormal ct- aortic stenosis.  The various methods of treatment have been discussed with the patient and family. After consideration of risks, benefits and other options for treatment, the patient has consented to  Procedure(s): RIGHT/LEFT HEART CATH AND CORONARY ANGIOGRAPHY (N/A) as a surgical intervention.  The patient's history has been reviewed, patient examined, no change in status, stable for surgery.  I have reviewed the patient's chart and labs.  Questions were answered to the patient's satisfaction.     Marcella Charlson

## 2023-11-30 NOTE — Telephone Encounter (Signed)
   Patient Name: Frank Zuniga  DOB: 08/10/1940 MRN: [id number]  Primary Cardiologist: None  Chart reviewed as part of pre-operative protocol coverage. Pre-op clearance already addressed by colleagues in earlier phone notes. To summarize recommendations:  -Recommend medical therapy for coronary artery disease and monitoring aortic stenosis for now. OM2 stenosis is approachable by PCI.  However, no indication now given that the patient has minimal symptoms related to this.  In addition, this will not change his preop cardiovascular risk and will only delay surgery given the need for antiplatelet medications.  He should be considered at relatively low risk for cardiovascular complications.  -Dr. Deatrice Cage  Okay to go forward with lumbar fusion without any further CV testing. Would be at acceptable risk to hold ASA x 5-7 days prior to surgery and resume when medically safe to do so.   Will route this bundled recommendation to requesting provider via Epic fax function and remove from pre-op pool. Please call with questions.  Frank LOISE Fabry, PA-C 11/30/2023, 4:31 PM

## 2023-12-28 ENCOUNTER — Other Ambulatory Visit: Payer: Self-pay | Admitting: Neurosurgery

## 2024-01-01 ENCOUNTER — Other Ambulatory Visit: Payer: Self-pay | Admitting: Neurosurgery

## 2024-01-02 NOTE — Progress Notes (Signed)
 Surgical Instructions   Your procedure is scheduled on Monday, October 20th. Report to [facility] Main Entrance A at 9:30 A.M., then check in with the Admitting office. Any questions or running late day of surgery: call [phone]  Questions prior to your surgery date: call [phone], Monday-Friday, 8am-4pm. If you experience any cold or flu symptoms such as cough, fever, chills, shortness of breath, etc. between now and your scheduled surgery, please notify us  at the above number.     Remember:  Do not eat or drink after midnight the night before your surgery. This includes no gum, mints, or hard candy.   Take these medicines the morning of surgery with A SIP OF WATER   cetirizine (ZYRTEC)  esomeprazole (NEXIUM)  predniSONE (DELTASONE)  tamsulosin (FLOMAX)    May take these medicines IF NEEDED: acetaminophen  (TYLENOL )     Hold your hydroxychloroquine (PLAQUENIL) for 3 day's prior to surgery.  Last dose on Thursday, October 16th.   Follow your surgeon's instructions on when to stop Asprin.  If no instructions were given by your surgeon then you will need to call the office to get those instructions.     One week prior to surgery, STOP taking any Aleve, Naproxen, Ibuprofen, Motrin, Advil, Goody's, BC's, all herbal medications, fish oil, and non-prescription vitamins.                     Do NOT Smoke (Tobacco/Vaping) for 24 hours prior to your procedure.  If you use a CPAP at night, you may bring your mask/headgear for your overnight stay.   You will be asked to remove any contacts, glasses, piercing's, hearing aid's, dentures/partials prior to surgery. Please bring cases for these items if needed.    Patients discharged the day of surgery will not be allowed to drive home, and someone needs to stay with them for 24 hours.  SURGICAL WAITING ROOM VISITATION Patients may have no more than 2 support people in the waiting area - these visitors may rotate.   Pre-op nurse  will coordinate an appropriate time for 1 ADULT support person, who may not rotate, to accompany patient in pre-op.  Children under the age of 10 must have an adult with them who is not the patient and must remain in the main waiting area with an adult.  If the patient needs to stay at the hospital during part of their recovery, the visitor guidelines for inpatient rooms apply.  Please refer to the [facility] website for the visitor guidelines for any additional information.   If you received a COVID test during your pre-op visit  it is requested that you wear a mask when out in public, stay away from anyone that may not be feeling well and notify your surgeon if you develop symptoms. If you have been in contact with anyone that has tested positive in the last 10 days please notify you surgeon.      Pre-operative 4 CHG Bathing Instructions   You can play a key role in reducing the risk of infection after surgery. Your skin needs to be as free of germs as possible. You can reduce the number of germs on your skin by washing with CHG (chlorhexidine  gluconate) soap before surgery. CHG is an antiseptic soap that kills germs and continues to kill germs even after washing.   DO NOT use if you have an allergy to chlorhexidine /CHG or antibacterial soaps. If your skin becomes reddened or irritated, stop using the CHG and  notify one of our RNs at [phone].   Please shower with the CHG soap starting 4 days before surgery using the following schedule:     Please keep in mind the following:  DO NOT shave, including legs and underarms, starting the day of your first shower.   Place clean sheets on your bed the day you start using CHG soap. Use a clean washcloth (not used since being washed) for each shower. DO NOT sleep with pets once you start using the CHG.   CHG Shower Instructions:  Wash your face and private area with normal soap. If you choose to wash your hair, wash first with your normal  shampoo.  After you use shampoo/soap, rinse your hair and body thoroughly to remove shampoo/soap residue.  Turn the water  OFF and apply  bottle of CHG soap to a CLEAN washcloth.  Apply CHG soap ONLY FROM YOUR NECK DOWN TO YOUR TOES (washing for 3-5 minutes)  DO NOT use CHG soap on face, private areas, open wounds, or sores.  Pay special attention to the area where your surgery is being performed.  If you are having back surgery, having someone wash your back for you may be helpful. Wait 2 minutes after CHG soap is applied, then you may rinse off the CHG soap.  Pat dry with a clean towel  Put on clean clothes/pajamas   If you choose to wear lotion, please use ONLY the CHG-compatible lotions that are listed below.  Additional instructions for the day of surgery:  If you choose, you may shower the morning of surgery with an antibacterial soap.  DO NOT APPLY any lotions, deodorants, powders, or perfumes.   Do not bring valuables to the hospital. [facility] is not responsible for any belongings/valuables. Do not wear nail polish, gel polish, artificial nails, or any other type of covering on natural nails (fingers and toes) Do not wear jewelry or makeup Put on clean/comfortable clothes.  Please brush your teeth.  Ask your nurse before applying any prescription medications to the skin.     CHG Compatible Lotions   Aveeno Moisturizing lotion  Cetaphil Moisturizing Cream  Cetaphil Moisturizing Lotion  Clairol Herbal Essence Moisturizing Lotion, Dry Skin  Clairol Herbal Essence Moisturizing Lotion, Extra Dry Skin  Clairol Herbal Essence Moisturizing Lotion, Normal Skin  Curel Age Defying Therapeutic Moisturizing Lotion with Alpha Hydroxy  Curel Extreme Care Body Lotion  Curel Soothing Hands Moisturizing Hand Lotion  Curel Therapeutic Moisturizing Cream, Fragrance-Free  Curel Therapeutic Moisturizing Lotion, Fragrance-Free  Curel Therapeutic Moisturizing Lotion, Original Formula   Eucerin Daily Replenishing Lotion  Eucerin Dry Skin Therapy Plus Alpha Hydroxy Crme  Eucerin Dry Skin Therapy Plus Alpha Hydroxy Lotion  Eucerin Original Crme  Eucerin Original Lotion  Eucerin Plus Crme Eucerin Plus Lotion  Eucerin TriLipid Replenishing Lotion  Keri Anti-Bacterial Hand Lotion  Keri Deep Conditioning Original Lotion Dry Skin Formula Softly Scented  Keri Deep Conditioning Original Lotion, Fragrance Free Sensitive Skin Formula  Keri Lotion Fast Absorbing Fragrance Free Sensitive Skin Formula  Keri Lotion Fast Absorbing Softly Scented Dry Skin Formula  Keri Original Lotion  Keri Skin Renewal Lotion Keri Silky Smooth Lotion  Keri Silky Smooth Sensitive Skin Lotion  Nivea Body Creamy Conditioning Oil  Nivea Body Extra Enriched Teacher, adult education Moisturizing Lotion Nivea Crme  Nivea Skin Firming Lotion  NutraDerm 30 Skin Lotion  NutraDerm Skin Lotion  NutraDerm Therapeutic Skin Cream  NutraDerm Therapeutic Skin Lotion  ProShield  Protective Hand Cream  Provon moisturizing lotion  Please read over the following fact sheets that you were given.

## 2024-01-03 ENCOUNTER — Other Ambulatory Visit: Payer: Self-pay

## 2024-01-03 ENCOUNTER — Encounter (HOSPITAL_COMMUNITY)
Admission: RE | Admit: 2024-01-03 | Discharge: 2024-01-03 | Disposition: A | Discharge status: Home / Self Care | Source: Ambulatory Visit | Attending: Neurosurgery | Admitting: Neurosurgery

## 2024-01-03 ENCOUNTER — Encounter (HOSPITAL_COMMUNITY): Payer: Self-pay

## 2024-01-03 VITALS — BP 129/66 | HR 70 | Temp 98.2°F | Resp 17 | Ht 65.0 in | Wt 152.8 lb

## 2024-01-03 DIAGNOSIS — Z01818 Encounter for other preprocedural examination: Secondary | ICD-10-CM

## 2024-01-03 DIAGNOSIS — Z79899 Other long term (current) drug therapy: Secondary | ICD-10-CM | POA: Insufficient documentation

## 2024-01-03 DIAGNOSIS — I350 Nonrheumatic aortic (valve) stenosis: Secondary | ICD-10-CM | POA: Diagnosis not present

## 2024-01-03 DIAGNOSIS — M069 Rheumatoid arthritis, unspecified: Secondary | ICD-10-CM | POA: Insufficient documentation

## 2024-01-03 DIAGNOSIS — D696 Thrombocytopenia, unspecified: Secondary | ICD-10-CM | POA: Insufficient documentation

## 2024-01-03 DIAGNOSIS — Z01812 Encounter for preprocedural laboratory examination: Secondary | ICD-10-CM | POA: Insufficient documentation

## 2024-01-03 DIAGNOSIS — K219 Gastro-esophageal reflux disease without esophagitis: Secondary | ICD-10-CM | POA: Insufficient documentation

## 2024-01-03 DIAGNOSIS — I10 Essential (primary) hypertension: Secondary | ICD-10-CM | POA: Diagnosis not present

## 2024-01-03 LAB — BASIC METABOLIC PANEL WITH GFR
Anion gap: 9 (ref 5–15)
BUN: 12 mg/dL (ref 8–23)
CO2: 28 mmol/L (ref 22–32)
Calcium: 9.8 mg/dL (ref 8.9–10.3)
Chloride: 103 mmol/L (ref 98–111)
Creatinine, Ser: 0.9 mg/dL (ref 0.61–1.24)
GFR, Estimated: >60 mL/min (ref >60)
Glucose, Bld: 114 mg/dL — ABNORMAL HIGH (ref 70–99)
Potassium: 3.6 mmol/L (ref 3.5–5.1)
Sodium: 140 mmol/L (ref 135–145)

## 2024-01-03 LAB — CBC
HCT: 43.6 % (ref 39.0–52.0)
Hemoglobin: 14.7 g/dL (ref 13.0–17.0)
MCH: 34.1 pg — ABNORMAL HIGH (ref 26.0–34.0)
MCHC: 33.7 g/dL (ref 30.0–36.0)
MCV: 101.2 fL — ABNORMAL HIGH (ref 80.0–100.0)
Platelets: 145 K/uL — ABNORMAL LOW (ref 150–400)
RBC: 4.31 MIL/uL (ref 4.22–5.81)
RDW: 13.8 % (ref 11.5–15.5)
WBC: 7.5 K/uL (ref 4.0–10.5)
nRBC: 0 % (ref 0.0–0.2)

## 2024-01-03 LAB — SURGICAL PCR SCREEN
MRSA, PCR: NEGATIVE
Staphylococcus aureus: POSITIVE — AB

## 2024-01-03 LAB — TYPE AND SCREEN
ABO/RH(D): B POS
Antibody Screen: NEGATIVE

## 2024-01-03 NOTE — Progress Notes (Signed)
 PCP - Dr. Lamar Simpers Cardiologist - Dr. Lamar Fitch LOV 10-24-23 follow up in 3 months  PPM/ICD - Denies Device Orders - n/a Rep Notified - n/a  Chest x-ray - n/a EKG - 10-24-23 Stress Test - Denies ECHO - 10-05-23 Cardiac Cath - 10-26-23  Sleep Study - Denies CPAP - n/a  NON-diabetic  Last dose of GLP1 agonist-  Denies GLP1 instructions: n/a  Blood Thinner Instructions: Denies Aspirin  Instructions: Per patient hold for 5 days.  Last dose was today on 01/03/24  ERAS Protcol - NPO PRE-SURGERY Ensure or G2- none  COVID TEST- n/a   Anesthesia review: Yes, HTN  Patient denies shortness of breath, fever, cough and chest pain at PAT appointment. Patient denies any respiratory issues at this time.    All instructions explained to the patient, with a verbal understanding of the material. Patient agrees to go over the instructions while at home for a better understanding. Patient also instructed to self quarantine after being tested for COVID-19. The opportunity to ask questions was provided.

## 2024-01-04 NOTE — Progress Notes (Signed)
 Anesthesia Chart Review:  83 year old male with pertinent history including HTN, GERD on PPI, rheumatoid arthritis.  Patient initially presented for the surgery on 08/23/2023 but was noted by Dr. Darlyn to have systolic murmur that had not previously been evaluated.  And recommended procedure be delayed to allow for cardiology evaluation.  Patient subsequently seen by Dr. Bernie on 09/19/2023.  Echo and coronary CTA were ordered.  Echo 10/05/2023 showed LVEF 60 to 65%, grade 1 DD, normal RV, mild aortic stenosis with mean gradient 12.8 mmHg and AVA 0.91 cm.  Coronary CTA with FFR analysis 10/16/2023 showed high likelihood of hemodynamically significance of mid OM1.  Patient subsequently had catheterization on 10/26/2023 showing borderline significant one-vessel coronary artery disease involving OM 2.  Medical therapy was recommended.  Per recommendations, Recommend medical therapy for coronary artery disease and monitoring aortic stenosis for now. OM2 stenosis is approachable by PCI.  However, no indication now given that the patient has minimal symptoms related to this.  In addition, this will not change his preop cardiovascular risk and will only delay surgery given the need for antiplatelet medications.  He should be considered at relatively low risk for cardiovascular complications.  Reviewed with anesthesiologist Dr. CHARLENA Needle, okay to proceed as planned barring acute status change. Advised will likely need a-line.   Preop labs reviewed, mild thrombocytopenia platelets 145, otherwise unremarkable.  EKG 10/24/2023: Sinus rhythm with Premature atrial complexes.  Rate 74. Incomplete right bundle branch block. Minimal voltage criteria for LVH, may be normal variant ( R in aVL ). Inferior infarct , age undetermined. Cannot rule out Anterior infarct , age undetermined  Cath 10/26/2023:   Mid LAD lesion is 30% stenosed.   2nd Mrg lesion is 70% stenosed.   Ost RCA lesion is 40% stenosed.   1.   Borderline significant one-vessel coronary artery disease involving OM 2. 2.  Left ventricular angiography was not performed.  EF was normal by echo. 3.  Right heart catheterization showed normal filling pressures, normal pulmonary pressure and high cardiac output. 4.  Mild aortic stenosis with mean gradient of 8 mmHg and valve area greater than 2 cm.   Recommendations: Recommend medical therapy for coronary artery disease and monitoring aortic stenosis for now. OM2 stenosis is approachable by PCI.  However, no indication now given that the patient has minimal symptoms related to this.  In addition, this will not change his preop cardiovascular risk and will only delay surgery given the need for antiplatelet medications.  He should be considered at relatively low risk for cardiovascular complications.  TTE 10/05/2023: 1. Left ventricular ejection fraction, by estimation, is 60 to 65%. The  left ventricle has normal function. The left ventricle has no regional  wall motion abnormalities. Left ventricular diastolic parameters are  consistent with Grade I diastolic  dysfunction (impaired relaxation). The average left ventricular global  longitudinal strain is -26.9 %. The global longitudinal strain is normal.   2. Right ventricular systolic function is normal. The right ventricular  size is normal. There is normal pulmonary artery systolic pressure.   3. The mitral valve is normal in structure. No evidence of mitral valve  regurgitation. No evidence of mitral stenosis.   4. The aortic valve was not well visualized. Aortic valve regurgitation  is not visualized. Mild aortic valve stenosis.   5. The inferior vena cava is normal in size with greater than 50%  respiratory variability, suggesting right atrial pressure of 3 mmHg.     Lynwood Hope, PA-C [facility]  Short Stay Center/Anesthesiology Phone [phone] 01/04/2024 4:42 PM

## 2024-01-04 NOTE — Anesthesia Preprocedure Evaluation (Addendum)
 Anesthesia Evaluation  Patient identified by MRN, date of birth, ID band Patient awake    Reviewed: Allergy & Precautions, H&P , NPO status , Patient's Chart, lab work & pertinent test results  History of Anesthesia Complications Negative for: history of anesthetic complications  Airway Mallampati: II  TM Distance: >3 FB Neck ROM: Full    Dental no notable dental hx.    Pulmonary neg COPD, former smoker   Pulmonary exam normal breath sounds clear to auscultation       Cardiovascular hypertension, + CAD  Normal cardiovascular exam+ Valvular Problems/Murmurs AS  Rhythm:Regular Rate:Normal  TTE read with mild AS but AVA 0.9 and DI 0.26. 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    IMPRESSIONS     1. Left ventricular ejection fraction, by estimation, is 60 to 65%. The  left ventricle has normal function. The left ventricle has no regional  wall motion abnormalities. Left ventricular diastolic parameters are  consistent with Grade I diastolic  dysfunction (impaired relaxation). The average left ventricular global  longitudinal strain is -26.9 %. The global longitudinal strain is normal.   2. Right ventricular systolic function is normal. The right ventricular  size is normal. There is normal pulmonary artery systolic pressure.   3. The mitral valve is normal in structure. No evidence of mitral valve  regurgitation. No evidence of mitral stenosis.   4. The aortic valve was not well visualized. Aortic valve regurgitation  is not visualized. Mild aortic valve stenosis.   5. The inferior vena cava is normal in size with greater than 50%  respiratory variability, suggesting right atrial pressure of 3 mmHg.      Neuro/Psych neg Headaches, neg Seizures  negative psych ROS   GI/Hepatic Neg liver ROS,GERD  ,,  Endo/Other  negative endocrine ROS    Renal/GU negative Renal ROS  negative genitourinary    Musculoskeletal  (+) Arthritis ,    Abdominal   Peds negative pediatric ROS (+)  Hematology negative hematology ROS (+)   Anesthesia Other Findings   Reproductive/Obstetrics negative OB ROS                              Anesthesia Physical Anesthesia Plan  ASA: 4  Anesthesia Plan: General   Post-op Pain Management:    Induction: Intravenous  PONV Risk Score and Plan: 2 and Ondansetron , Dexamethasone  and Treatment may vary due to age or medical condition  Airway Management Planned: Oral ETT  Additional Equipment: Arterial line  Intra-op Plan:   Post-operative Plan: Extubation in OR  Informed Consent: I have reviewed the patients History and Physical, chart, labs and discussed the procedure including the risks, benefits and alternatives for the proposed anesthesia with the patient or authorized representative who has indicated his/her understanding and acceptance.     Dental advisory given  Plan Discussed with: CRNA  Anesthesia Plan Comments: (PAT note by Lynwood Hope, PA-C:  83 year old male with pertinent history including HTN, GERD on PPI, rheumatoid arthritis.  Patient initially presented for the surgery on 08/23/2023 but was noted by Dr. Darlyn to have systolic murmur that had not previously been evaluated.  And recommended procedure be delayed to allow for cardiology evaluation.  Patient subsequently seen by Dr. Bernie on 09/19/2023.  Echo and coronary CTA were ordered.  Echo 10/05/2023 showed LVEF 60 to 65%, grade 1 DD, normal RV, mild aortic stenosis with mean gradient 12.8 mmHg and AVA 0.91  cm.  Coronary CTA with FFR analysis 10/16/2023 showed high likelihood of hemodynamically significance of mid OM1.  Patient subsequently had catheterization on 10/26/2023 showing borderline significant one-vessel coronary artery disease involving OM 2.  Medical therapy was recommended.  Per recommendations, Recommend medical therapy for coronary artery  disease and monitoring aortic stenosis for now. OM2 stenosis is approachable by PCI.  However, no indication now given that the patient has minimal symptoms related to this.  In addition, this will not change his preop cardiovascular risk and will only delay surgery given the need for antiplatelet medications.  He should be considered at relatively low risk for cardiovascular complications.  Reviewed with anesthesiologist Dr. CHARLENA Needle, okay to proceed as planned barring acute status change. Advised will likely need a-line.   Preop labs reviewed, mild thrombocytopenia platelets 145, otherwise unremarkable.  EKG 10/24/2023: Sinus rhythm with Premature atrial complexes.  Rate 74. Incomplete right bundle branch block. Minimal voltage criteria for LVH, may be normal variant ( R in aVL ). Inferior infarct , age undetermined. Cannot rule out Anterior infarct , age undetermined  Cath 10/26/2023:   Mid LAD lesion is 30% stenosed.   2nd Mrg lesion is 70% stenosed.   Ost RCA lesion is 40% stenosed.   1.  Borderline significant one-vessel coronary artery disease involving OM 2. 2.  Left ventricular angiography was not performed.  EF was normal by echo. 3.  Right heart catheterization showed normal filling pressures, normal pulmonary pressure and high cardiac output. 4.  Mild aortic stenosis with mean gradient of 8 mmHg and valve area greater than 2 cm.   Recommendations: Recommend medical therapy for coronary artery disease and monitoring aortic stenosis for now. OM2 stenosis is approachable by PCI.  However, no indication now given that the patient has minimal symptoms related to this.  In addition, this will not change his preop cardiovascular risk and will only delay surgery given the need for antiplatelet medications.  He should be considered at relatively low risk for cardiovascular complications.  TTE 10/05/2023: 1. Left ventricular ejection fraction, by estimation, is 60 to 65%. The  left  ventricle has normal function. The left ventricle has no regional  wall motion abnormalities. Left ventricular diastolic parameters are  consistent with Grade I diastolic  dysfunction (impaired relaxation). The average left ventricular global  longitudinal strain is -26.9 %. The global longitudinal strain is normal.   2. Right ventricular systolic function is normal. The right ventricular  size is normal. There is normal pulmonary artery systolic pressure.   3. The mitral valve is normal in structure. No evidence of mitral valve  regurgitation. No evidence of mitral stenosis.   4. The aortic valve was not well visualized. Aortic valve regurgitation  is not visualized. Mild aortic valve stenosis.   5. The inferior vena cava is normal in size with greater than 50%  respiratory variability, suggesting right atrial pressure of 3 mmHg.   )         Anesthesia Quick Evaluation

## 2024-01-08 ENCOUNTER — Ambulatory Visit (HOSPITAL_COMMUNITY)

## 2024-01-08 ENCOUNTER — Ambulatory Visit (HOSPITAL_COMMUNITY): Payer: Self-pay | Admitting: Physician Assistant

## 2024-01-08 ENCOUNTER — Ambulatory Visit (HOSPITAL_COMMUNITY): Admission: RE | Disposition: A | Payer: Self-pay | Source: Home / Self Care | Attending: Neurosurgery

## 2024-01-08 ENCOUNTER — Ambulatory Visit (HOSPITAL_COMMUNITY)
Admission: RE | Admit: 2024-01-08 | Discharge: 2024-01-10 | Disposition: A | Discharge status: Home / Self Care | Attending: Neurosurgery | Admitting: Neurosurgery

## 2024-01-08 ENCOUNTER — Ambulatory Visit (HOSPITAL_COMMUNITY): Payer: Self-pay | Admitting: Anesthesiology

## 2024-01-08 ENCOUNTER — Encounter (HOSPITAL_COMMUNITY): Payer: Self-pay | Admitting: Neurosurgery

## 2024-01-08 DIAGNOSIS — I10 Essential (primary) hypertension: Secondary | ICD-10-CM | POA: Insufficient documentation

## 2024-01-08 DIAGNOSIS — I2510 Atherosclerotic heart disease of native coronary artery without angina pectoris: Secondary | ICD-10-CM

## 2024-01-08 DIAGNOSIS — M4726 Other spondylosis with radiculopathy, lumbar region: Secondary | ICD-10-CM | POA: Insufficient documentation

## 2024-01-08 DIAGNOSIS — M48062 Spinal stenosis, lumbar region with neurogenic claudication: Secondary | ICD-10-CM | POA: Insufficient documentation

## 2024-01-08 DIAGNOSIS — M5117 Intervertebral disc disorders with radiculopathy, lumbosacral region: Secondary | ICD-10-CM | POA: Insufficient documentation

## 2024-01-08 DIAGNOSIS — M5116 Intervertebral disc disorders with radiculopathy, lumbar region: Secondary | ICD-10-CM | POA: Insufficient documentation

## 2024-01-08 DIAGNOSIS — M4316 Spondylolisthesis, lumbar region: Secondary | ICD-10-CM | POA: Diagnosis present

## 2024-01-08 DIAGNOSIS — Z87891 Personal history of nicotine dependence: Secondary | ICD-10-CM | POA: Diagnosis not present

## 2024-01-08 DIAGNOSIS — I350 Nonrheumatic aortic (valve) stenosis: Secondary | ICD-10-CM | POA: Insufficient documentation

## 2024-01-08 DIAGNOSIS — K219 Gastro-esophageal reflux disease without esophagitis: Secondary | ICD-10-CM | POA: Diagnosis not present

## 2024-01-08 LAB — GLUCOSE, CAPILLARY: Glucose-Capillary: 150 mg/dL — ABNORMAL HIGH (ref 70–99)

## 2024-01-08 SURGERY — POSTERIOR LUMBAR FUSION 1 LEVEL
Anesthesia: General | Site: Spine Lumbar

## 2024-01-08 MED ORDER — PHENYLEPHRINE 80 MCG/ML (10ML) SYRINGE FOR IV PUSH (FOR BLOOD PRESSURE SUPPORT)
PREFILLED_SYRINGE | INTRAVENOUS | Status: DC | PRN
  Administered 2024-01-08 (×7): 80 ug via INTRAVENOUS

## 2024-01-08 MED ORDER — ROCURONIUM BROMIDE 10 MG/ML (PF) SYRINGE
PREFILLED_SYRINGE | INTRAVENOUS | Status: AC
Start: 2024-01-08 — End: 2024-01-08
  Filled 2024-01-08: qty 10

## 2024-01-08 MED ORDER — HYDROCORTISONE SOD SUC (PF) 100 MG IJ SOLR
50.0000 mg | Freq: Three times a day (TID) | INTRAMUSCULAR | Status: AC
  Administered 2024-01-08 – 2024-01-09 (×2): 50 mg via INTRAVENOUS
  Filled 2024-01-08 (×3): qty 1

## 2024-01-08 MED ORDER — LACTATED RINGERS IV SOLN: INTRAVENOUS | Status: DC

## 2024-01-08 MED ORDER — BACITRACIN ZINC 500 UNIT/GM EX OINT
TOPICAL_OINTMENT | CUTANEOUS | Status: AC
  Filled 2024-01-08: qty 28.35

## 2024-01-08 MED ORDER — THROMBIN 5000 UNITS EX KIT
PACK | CUTANEOUS | Status: AC
  Filled 2024-01-08: qty 1

## 2024-01-08 MED ORDER — BUPIVACAINE-EPINEPHRINE (PF) 0.5% -1:200000 IJ SOLN
INTRAMUSCULAR | Status: DC | PRN
  Administered 2024-01-08: 10 mL

## 2024-01-08 MED ORDER — THROMBIN (RECOMBINANT) 5000 UNITS EX SOLR
CUTANEOUS | Status: AC
  Filled 2024-01-08: qty 5000

## 2024-01-08 MED ORDER — SODIUM CHLORIDE (PF) 0.9 % IJ SOLN
INTRAMUSCULAR | Status: AC
  Filled 2024-01-08: qty 20

## 2024-01-08 MED ORDER — SODIUM CHLORIDE 0.9% FLUSH
3.0000 mL | Freq: Two times a day (BID) | INTRAVENOUS | Status: DC
  Administered 2024-01-08: 3 mL via INTRAVENOUS

## 2024-01-08 MED ORDER — ACETAMINOPHEN 325 MG PO TABS: 650.0000 mg | ORAL_TABLET | ORAL | Status: DC | PRN

## 2024-01-08 MED ORDER — ACETAMINOPHEN 10 MG/ML IV SOLN
INTRAVENOUS | Status: DC | PRN
  Administered 2024-01-08: 1000 mg via INTRAVENOUS

## 2024-01-08 MED ORDER — OLMESARTAN MEDOXOMIL 20 MG PO TABS
40.0000 mg | ORAL_TABLET | Freq: Every day | ORAL | Status: DC
  Administered 2024-01-09: 40 mg via ORAL
  Filled 2024-01-08 (×2): qty 2
  Filled 2024-01-08: qty 1
  Filled 2024-01-08: qty 2

## 2024-01-08 MED ORDER — TAMSULOSIN HCL 0.4 MG PO CAPS
0.4000 mg | ORAL_CAPSULE | Freq: Every morning | ORAL | Status: DC
  Administered 2024-01-09 – 2024-01-10 (×2): 0.4 mg via ORAL
  Filled 2024-01-08 (×2): qty 1

## 2024-01-08 MED ORDER — ONDANSETRON HCL 4 MG/2ML IJ SOLN
INTRAMUSCULAR | Status: AC
  Filled 2024-01-08: qty 2

## 2024-01-08 MED ORDER — PHENYLEPHRINE HCL-NACL 20-0.9 MG/250ML-% IV SOLN
INTRAVENOUS | Status: DC | PRN
  Administered 2024-01-08: 10 ug/min via INTRAVENOUS

## 2024-01-08 MED ORDER — CHLORHEXIDINE GLUCONATE 0.12 % MT SOLN
15.0000 mL | Freq: Once | OROMUCOSAL | Status: AC
  Administered 2024-01-08: 15 mL via OROMUCOSAL
  Filled 2024-01-08: qty 15

## 2024-01-08 MED ORDER — PROPOFOL 10 MG/ML IV BOLUS
INTRAVENOUS | Status: DC | PRN
  Administered 2024-01-08: 100 mg via INTRAVENOUS
  Administered 2024-01-08 (×2): 50 mg via INTRAVENOUS

## 2024-01-08 MED ORDER — LIDOCAINE 2% (20 MG/ML) 5 ML SYRINGE
INTRAMUSCULAR | Status: DC | PRN
  Administered 2024-01-08: 100 mg via INTRAVENOUS

## 2024-01-08 MED ORDER — PHENOL 1.4 % MT LIQD: 1.0000 | OROMUCOSAL | Status: DC | PRN

## 2024-01-08 MED ORDER — CYCLOBENZAPRINE HCL 10 MG PO TABS
10.0000 mg | ORAL_TABLET | Freq: Three times a day (TID) | ORAL | Status: DC | PRN
Start: 2024-01-08 — End: 2024-01-10
  Administered 2024-01-10: 10 mg via ORAL
  Filled 2024-01-08: qty 1

## 2024-01-08 MED ORDER — DOCUSATE SODIUM 100 MG PO CAPS
100.0000 mg | ORAL_CAPSULE | Freq: Two times a day (BID) | ORAL | Status: DC
Start: 2024-01-08 — End: 2024-01-10
  Administered 2024-01-08 – 2024-01-10 (×4): 100 mg via ORAL
  Filled 2024-01-08 (×4): qty 1

## 2024-01-08 MED ORDER — ACETAMINOPHEN 650 MG RE SUPP: 650.0000 mg | RECTAL | Status: DC | PRN

## 2024-01-08 MED ORDER — THROMBIN 5000 UNITS EX SOLR
OROMUCOSAL | Status: DC | PRN
  Administered 2024-01-08 (×2): 5 mL via TOPICAL

## 2024-01-08 MED ORDER — BISACODYL 10 MG RE SUPP: 10.0000 mg | Freq: Every day | RECTAL | Status: DC | PRN

## 2024-01-08 MED ORDER — FENTANYL CITRATE (PF) 100 MCG/2ML IJ SOLN
INTRAMUSCULAR | Status: AC
  Filled 2024-01-08: qty 2

## 2024-01-08 MED ORDER — AMLODIPINE BESYLATE 5 MG PO TABS
5.0000 mg | ORAL_TABLET | Freq: Every day | ORAL | Status: DC
  Administered 2024-01-08 – 2024-01-10 (×3): 5 mg via ORAL
  Filled 2024-01-08 (×3): qty 1

## 2024-01-08 MED ORDER — BACITRACIN ZINC 500 UNIT/GM EX OINT
TOPICAL_OINTMENT | CUTANEOUS | Status: DC | PRN
  Administered 2024-01-08: 1 via TOPICAL

## 2024-01-08 MED ORDER — BUPIVACAINE-EPINEPHRINE (PF) 0.5% -1:200000 IJ SOLN
INTRAMUSCULAR | Status: AC
Start: 2024-01-08 — End: 2024-01-08
  Filled 2024-01-08: qty 30

## 2024-01-08 MED ORDER — MENTHOL 3 MG MT LOZG: 1.0000 | LOZENGE | OROMUCOSAL | Status: DC | PRN

## 2024-01-08 MED ORDER — SUGAMMADEX SODIUM 200 MG/2ML IV SOLN
INTRAVENOUS | Status: DC | PRN
Start: 2024-01-08 — End: 2024-01-08
  Administered 2024-01-08: 200 mg via INTRAVENOUS

## 2024-01-08 MED ORDER — OLMESARTAN-AMLODIPINE-HCTZ 40-5-12.5 MG PO TABS: 1.0000 | ORAL_TABLET | Freq: Every morning | ORAL | Status: DC

## 2024-01-08 MED ORDER — ONDANSETRON HCL 4 MG PO TABS: 4.0000 mg | ORAL_TABLET | Freq: Four times a day (QID) | ORAL | Status: DC | PRN

## 2024-01-08 MED ORDER — ONDANSETRON HCL 4 MG/2ML IJ SOLN: 4.0000 mg | Freq: Four times a day (QID) | INTRAMUSCULAR | Status: DC | PRN

## 2024-01-08 MED ORDER — ONDANSETRON HCL 4 MG/2ML IJ SOLN
INTRAMUSCULAR | Status: DC | PRN
  Administered 2024-01-08: 4 mg via INTRAVENOUS

## 2024-01-08 MED ORDER — ACETAMINOPHEN 500 MG PO TABS
1000.0000 mg | ORAL_TABLET | Freq: Four times a day (QID) | ORAL | Status: AC
  Administered 2024-01-08 – 2024-01-09 (×4): 1000 mg via ORAL
  Filled 2024-01-08 (×4): qty 2

## 2024-01-08 MED ORDER — CEFAZOLIN SODIUM-DEXTROSE 2-4 GM/100ML-% IV SOLN
2.0000 g | INTRAVENOUS | Status: AC
Start: 2024-01-08 — End: 2024-01-08
  Administered 2024-01-08: 2 g via INTRAVENOUS
  Filled 2024-01-08: qty 100

## 2024-01-08 MED ORDER — ROCURONIUM BROMIDE 10 MG/ML (PF) SYRINGE
PREFILLED_SYRINGE | INTRAVENOUS | Status: DC | PRN
  Administered 2024-01-08: 10 mg via INTRAVENOUS
  Administered 2024-01-08: 70 mg via INTRAVENOUS

## 2024-01-08 MED ORDER — OXYCODONE HCL 5 MG PO TABS
5.0000 mg | ORAL_TABLET | ORAL | Status: DC | PRN
  Administered 2024-01-08 – 2024-01-09 (×4): 5 mg via ORAL
  Filled 2024-01-08 (×4): qty 1

## 2024-01-08 MED ORDER — ESMOLOL HCL 100 MG/10ML IV SOLN
INTRAVENOUS | Status: DC | PRN
  Administered 2024-01-08: 40 mg via INTRAVENOUS
  Administered 2024-01-08: 60 mg via INTRAVENOUS

## 2024-01-08 MED ORDER — VASOPRESSIN 20 UNIT/ML IV SOLN
INTRAVENOUS | Status: DC | PRN
Start: 2024-01-08 — End: 2024-01-08
  Administered 2024-01-08 (×8): 1 [IU] via INTRAVENOUS

## 2024-01-08 MED ORDER — LIDOCAINE 2% (20 MG/ML) 5 ML SYRINGE
INTRAMUSCULAR | Status: AC
  Filled 2024-01-08: qty 5

## 2024-01-08 MED ORDER — 0.9 % SODIUM CHLORIDE (POUR BTL) OPTIME
TOPICAL | Status: DC | PRN
  Administered 2024-01-08: 1000 mL

## 2024-01-08 MED ORDER — HYDROCHLOROTHIAZIDE 12.5 MG PO TABS
12.5000 mg | ORAL_TABLET | Freq: Every day | ORAL | Status: DC
  Administered 2024-01-08 – 2024-01-10 (×3): 12.5 mg via ORAL
  Filled 2024-01-08 (×3): qty 1

## 2024-01-08 MED ORDER — BUPIVACAINE LIPOSOME 1.3 % IJ SUSP: INTRAMUSCULAR | Status: DC | PRN

## 2024-01-08 MED ORDER — PHENYLEPHRINE 80 MCG/ML (10ML) SYRINGE FOR IV PUSH (FOR BLOOD PRESSURE SUPPORT)
PREFILLED_SYRINGE | INTRAVENOUS | Status: AC
  Filled 2024-01-08: qty 10

## 2024-01-08 MED ORDER — SODIUM CHLORIDE 0.9 % IV SOLN
250.0000 mL | INTRAVENOUS | Status: AC
  Administered 2024-01-08: 250 mL via INTRAVENOUS

## 2024-01-08 MED ORDER — ORAL CARE MOUTH RINSE: 15.0000 mL | Freq: Once | OROMUCOSAL | Status: AC

## 2024-01-08 MED ORDER — SODIUM CHLORIDE 0.9 % IV SOLN: INTRAVENOUS | Status: DC | PRN

## 2024-01-08 MED ORDER — VASHE WOUND IRRIGATION OPTIME
TOPICAL | Status: DC | PRN
Start: 2024-01-08 — End: 2024-01-08
  Administered 2024-01-08: 34 [oz_av]

## 2024-01-08 MED ORDER — VASOPRESSIN 20 UNITS/100 ML INFUSION FOR SHOCK
INTRAVENOUS | Status: DC | PRN
  Administered 2024-01-08: .02 [IU]/min via INTRAVENOUS

## 2024-01-08 MED ORDER — LORATADINE 10 MG PO TABS
10.0000 mg | ORAL_TABLET | Freq: Every day | ORAL | Status: DC
  Administered 2024-01-09 – 2024-01-10 (×2): 10 mg via ORAL
  Filled 2024-01-08 (×2): qty 1

## 2024-01-08 MED ORDER — CEFAZOLIN SODIUM-DEXTROSE 2-4 GM/100ML-% IV SOLN
2.0000 g | Freq: Three times a day (TID) | INTRAVENOUS | Status: AC
  Administered 2024-01-08 – 2024-01-09 (×2): 2 g via INTRAVENOUS
  Filled 2024-01-08 (×2): qty 100

## 2024-01-08 MED ORDER — SODIUM CHLORIDE 0.9% FLUSH: 3.0000 mL | INTRAVENOUS | Status: DC | PRN

## 2024-01-08 MED ORDER — PREDNISONE 5 MG PO TABS
5.0000 mg | ORAL_TABLET | Freq: Every morning | ORAL | Status: DC
  Administered 2024-01-09 – 2024-01-10 (×2): 5 mg via ORAL
  Filled 2024-01-08 (×2): qty 1

## 2024-01-08 MED ORDER — OXYCODONE HCL 5 MG PO TABS
10.0000 mg | ORAL_TABLET | ORAL | Status: DC | PRN
  Administered 2024-01-10 (×3): 10 mg via ORAL
  Filled 2024-01-08 (×3): qty 2

## 2024-01-08 MED ORDER — PANTOPRAZOLE SODIUM 40 MG PO TBEC
80.0000 mg | DELAYED_RELEASE_TABLET | Freq: Every day | ORAL | Status: DC
  Administered 2024-01-09 – 2024-01-10 (×2): 80 mg via ORAL
  Filled 2024-01-08 (×2): qty 2

## 2024-01-08 MED ORDER — BUPIVACAINE LIPOSOME 1.3 % IJ SUSP
INTRAMUSCULAR | Status: AC
  Filled 2024-01-08: qty 20

## 2024-01-08 MED ORDER — DEXAMETHASONE SOD PHOSPHATE PF 10 MG/ML IJ SOLN
INTRAMUSCULAR | Status: DC | PRN
  Administered 2024-01-08: 10 mg via INTRAVENOUS

## 2024-01-08 MED ORDER — ALBUMIN HUMAN 5 % IV SOLN: INTRAVENOUS | Status: DC | PRN

## 2024-01-08 MED ORDER — CHLORHEXIDINE GLUCONATE CLOTH 2 % EX PADS: 6.0000 | MEDICATED_PAD | Freq: Once | CUTANEOUS | Status: DC

## 2024-01-08 MED ORDER — CHLORHEXIDINE GLUCONATE CLOTH 2 % EX PADS
6.0000 | MEDICATED_PAD | Freq: Once | CUTANEOUS | Status: AC
  Administered 2024-01-08: 6 via TOPICAL

## 2024-01-08 MED ORDER — MORPHINE SULFATE (PF) 2 MG/ML IV SOLN: 2.0000 mg | INTRAVENOUS | Status: DC | PRN

## 2024-01-08 MED ORDER — FENTANYL CITRATE (PF) 250 MCG/5ML IJ SOLN
INTRAMUSCULAR | Status: DC | PRN
  Administered 2024-01-08 (×4): 50 ug via INTRAVENOUS

## 2024-01-08 SURGICAL SUPPLY — 59 items
BAG COUNTER SPONGE SURGICOUNT (BAG) ×1 IMPLANT
BASKET BONE COLLECTION (BASKET) ×1 IMPLANT
BENZOIN TINCTURE PRP APPL 2/3 (GAUZE/BANDAGES/DRESSINGS) ×1 IMPLANT
BLADE CLIPPER SURG (BLADE) IMPLANT
BUR MATCHSTICK NEURO 3.0 LAGG (BURR) ×1 IMPLANT
BUR PRECISION FLUTE 6.0 (BURR) ×1 IMPLANT
CANISTER SUCTION 3000ML PPV (SUCTIONS) ×1 IMPLANT
CAP LOCK DLX THRD (Cap) IMPLANT
CLEANSER WND VASHE INSTL 34OZ (WOUND CARE) ×1 IMPLANT
CNTNR URN SCR LID CUP LEK RST (MISCELLANEOUS) ×1 IMPLANT
COVER BACK TABLE 60X90IN (DRAPES) ×1 IMPLANT
DRAPE C-ARM 42X72 X-RAY (DRAPES) ×2 IMPLANT
DRAPE HALF SHEET 40X57 (DRAPES) ×1 IMPLANT
DRAPE LAPAROTOMY 100X72X124 (DRAPES) ×1 IMPLANT
DRAPE SURG 17X23 STRL (DRAPES) ×1 IMPLANT
DRSG OPSITE POSTOP 4X10 (GAUZE/BANDAGES/DRESSINGS) IMPLANT
DRSG OPSITE POSTOP 4X6 (GAUZE/BANDAGES/DRESSINGS) ×1 IMPLANT
ELECTRODE BLDE 4.0 EZ CLN MEGD (MISCELLANEOUS) ×1 IMPLANT
ELECTRODE REM PT RTRN 9FT ADLT (ELECTROSURGICAL) ×1 IMPLANT
EVACUATOR 1/8 PVC DRAIN (DRAIN) ×1 IMPLANT
GAUZE 4X4 16PLY ~~LOC~~+RFID DBL (SPONGE) ×1 IMPLANT
GLOVE BIO SURGEON STRL SZ 6 (GLOVE) ×1 IMPLANT
GLOVE BIO SURGEON STRL SZ8 (GLOVE) ×2 IMPLANT
GLOVE BIO SURGEON STRL SZ8.5 (GLOVE) ×2 IMPLANT
GLOVE BIOGEL PI IND STRL 6.5 (GLOVE) ×1 IMPLANT
GOWN STRL REUS W/ TWL LRG LVL3 (GOWN DISPOSABLE) ×1 IMPLANT
GOWN STRL REUS W/ TWL XL LVL3 (GOWN DISPOSABLE) ×2 IMPLANT
GOWN STRL REUS W/TWL 2XL LVL3 (GOWN DISPOSABLE) IMPLANT
HEMOSTAT POWDER KIT SURGIFOAM (HEMOSTASIS) ×1 IMPLANT
KIT BASIN OR (CUSTOM PROCEDURE TRAY) ×1 IMPLANT
KIT GRAFTMAG DEL NEURO DISP (NEUROSURGERY SUPPLIES) IMPLANT
KIT POSITIONER JACKSON TABLE (MISCELLANEOUS) ×1 IMPLANT
KIT TURNOVER KIT B (KITS) ×1 IMPLANT
NDL HYPO 21X1.5 SAFETY (NEEDLE) ×1 IMPLANT
NDL HYPO 22X1.5 SAFETY MO (MISCELLANEOUS) ×1 IMPLANT
NEEDLE HYPO 21X1.5 SAFETY (NEEDLE) ×1 IMPLANT
NEEDLE HYPO 22X1.5 SAFETY MO (MISCELLANEOUS) ×1 IMPLANT
PACK LAMINECTOMY NEURO (CUSTOM PROCEDURE TRAY) ×1 IMPLANT
PAD ARMBOARD POSITIONER FOAM (MISCELLANEOUS) ×3 IMPLANT
PATTIES SURGICAL .5 X1 (DISPOSABLE) IMPLANT
PUTTY DBM 10CC CALC GRAN (Putty) IMPLANT
ROD CURVED TI 6.35X35 (Rod) IMPLANT
SCREW PA DLX CREO 7.5X50 (Screw) IMPLANT
SOLN 0.9% NACL POUR BTL 1000ML (IV SOLUTION) ×1 IMPLANT
SOLN STERILE WATER BTL 1000 ML (IV SOLUTION) ×1 IMPLANT
SPACER ALTERA 10X31 8-12MM-8 (Spacer) IMPLANT
SPIKE FLUID TRANSFER (MISCELLANEOUS) ×1 IMPLANT
SPONGE NEURO XRAY DETECT 1X3 (DISPOSABLE) IMPLANT
SPONGE SURGIFOAM ABS GEL 100 (HEMOSTASIS) IMPLANT
SPONGE SURGIFOAM ABS GEL SZ50 (HEMOSTASIS) IMPLANT
SPONGE T-LAP 4X18 ~~LOC~~+RFID (SPONGE) IMPLANT
STRIP CLOSURE SKIN 1/2X4 (GAUZE/BANDAGES/DRESSINGS) ×1 IMPLANT
SUT PROLENE 6 0 BV (SUTURE) IMPLANT
SUT VIC AB 1 CT1 18XBRD ANBCTR (SUTURE) ×2 IMPLANT
SUT VIC AB 2-0 CP2 18 (SUTURE) ×2 IMPLANT
SYR 20ML LL LF (SYRINGE) IMPLANT
TOWEL GREEN STERILE (TOWEL DISPOSABLE) ×1 IMPLANT
TOWEL GREEN STERILE FF (TOWEL DISPOSABLE) ×1 IMPLANT
TRAY FOLEY MTR SLVR 16FR STAT (SET/KITS/TRAYS/PACK) ×1 IMPLANT

## 2024-01-08 NOTE — H&P (Signed)
 Subjective: The patient is an 83 year old white male who has complained of back and left greater than right leg pain.  He decided to proceed with surgery.  This was postponed because of a heart murmur and cardiac workup demonstrated some aortic stenosis.  He has been cleared for surgery.  Past Medical History:  Diagnosis Date   Arthritis    Cancer [facility])    prostate   Family history of adverse reaction to anesthesia    Died with surgery; chest surgery   GERD (gastroesophageal reflux disease)    Hypertension     Past Surgical History:  Procedure Laterality Date   HERNIA REPAIR     x 3   PROSTATE SURGERY     RIGHT/LEFT HEART CATH AND CORONARY ANGIOGRAPHY N/A 10/26/2023   Procedure: RIGHT/LEFT HEART CATH AND CORONARY ANGIOGRAPHY;  Surgeon: Darron Deatrice LABOR, MD;  Location: [facility] INVASIVE CV LAB;  Service: Cardiovascular;  Laterality: N/A;   TONSILLECTOMY      Allergies  Allergen Reactions   Prevnar 13 [Pneumococcal 13-Val Conj Vacc] Rash    Fever, chills, Pain    Social History   Tobacco Use   Smoking status: Former    Current packs/day: 0.00    Types: Cigarettes    Quit date: 1974    Years since quitting: 51.8   Smokeless tobacco: Never  Substance Use Topics   Alcohol use: Never    History reviewed. No pertinent family history. Prior to Admission medications   Medication Sig Start Date End Date Taking? Authorizing Provider  acetaminophen  (TYLENOL ) 500 MG tablet Take 500-1,000 mg by mouth every 6 (six) hours as needed (pain.).   Yes [provider]  aspirin  81 MG EC tablet Take 81 mg by mouth daily. 02/08/20  Yes [provider]  celecoxib (CELEBREX) 200 MG capsule Take 200 mg by mouth 2 (two) times daily.   Yes [provider]  cetirizine (ZYRTEC) 10 MG tablet Take 10 mg by mouth daily.   Yes [provider]  Cholecalciferol 25 MCG (1000 UT) tablet Take 1,000 Units by mouth 2 (two) times daily. 02/08/20  Yes [provider]   esomeprazole (NEXIUM) 40 MG capsule Take 40 mg by mouth daily before breakfast. 12/21/16  Yes [provider]  folic acid (FOLVITE) 1 MG tablet Take 1 mg by mouth daily. 04/15/15  Yes [provider]  hydroxychloroquine (PLAQUENIL) 200 MG tablet Take 200 mg by mouth 2 (two) times daily. 04/15/15  Yes [provider]  hydroxypropyl methylcellulose / hypromellose (ISOPTO TEARS / GONIOVISC) 2.5 % ophthalmic solution Place 1 drop into both eyes daily as needed for dry eyes.   Yes [provider]  Anselm Oil 350 MG CAPS Take 350 mg by mouth in the morning. 02/08/20  Yes [provider]  methotrexate 50 MG/2ML injection Inject 25 mg into the muscle every Friday. 04/15/15  Yes [provider]  Multiple Vitamins-Minerals (CENTRUM SILVER 50+MEN) TABS Take 1 tablet by mouth daily. 02/08/20  Yes [provider]  niacin (TRUE VITAMIN B3) 500 MG tablet Take 500 mg by mouth in the morning and at bedtime.   Yes [provider]  Niacin (VITAMIN B-3 PO) Take 1 tablet by mouth in the morning.   Yes [provider]  Olmesartan-amLODIPine-HCTZ (TRIBENZOR) 40-5-12.5 MG TABS Take 1 tablet by mouth in the morning. 03/26/18  Yes [provider]  OVER THE COUNTER MEDICATION Take 1 tablet by mouth 2 (two) times daily. Prostate plus/ Urino Zinc  Yes [provider]  predniSONE (DELTASONE) 5 MG tablet Take 5 mg by mouth in the morning. 01/30/18  Yes [provider]  Probiotic Product (FLORAJEN3 PO) Take 1 capsule by mouth daily.   Yes [provider]  sulfaSALAzine (AZULFIDINE) 500 MG tablet Take 1,000 mg by mouth 2 (two) times daily. 03/30/15  Yes [provider]  tamsulosin (FLOMAX) 0.4 MG CAPS capsule Take 0.4 mg by mouth in the morning.   Yes [provider]  calcium carbonate (SUPER CALCIUM) 1500 (600 Ca) MG TABS tablet Take 600 mg of elemental calcium by mouth 2 (two) times daily with a meal.  02/08/20   [provider]     Review of Systems  Positive ROS: As above  All other systems have been reviewed and were otherwise negative with the exception of those mentioned in the HPI and as above.  Objective: Vital signs in last 24 hours: Temp:  [97.6 F (36.4 C)] 97.6 F (36.4 C) (10/20 0949) Pulse Rate:  [94] 94 (10/20 0949) Resp:  [14-18] 14 (10/20 0951) BP: (163)/(74) 163/74 (10/20 0949) SpO2:  [94 %] 94 % (10/20 0949) Weight:  [69.3 kg] 69.3 kg (10/20 1019) Estimated body mass index is 25.43 kg/m as calculated from the following:   Height as of this encounter: 5' 5 (1.651 m).   Weight as of this encounter: 69.3 kg.   General Appearance: Alert Head: Normocephalic, without obvious abnormality, atraumatic Eyes: PERRL, conjunctiva/corneas clear, EOM's intact,    Ears: Normal  Throat: Normal  Neck: Supple, Back: unremarkable Lungs: Clear to auscultation bilaterally, respirations unlabored Heart: Regular rate and rhythm, no murmur, rub or gallop Abdomen: Soft, non-tender Extremities: Extremities normal, atraumatic, no cyanosis or edema Skin: unremarkable  NEUROLOGIC:   Mental status: alert and oriented,Motor Exam - grossly normal Sensory Exam - grossly normal Reflexes:  Coordination - grossly normal Gait - grossly normal Balance - grossly normal Cranial Nerves: I: smell Not tested  II: visual acuity  OS: Normal  OD: Normal   II: visual fields Full to confrontation  II: pupils Equal, round, reactive to light  III,VII: ptosis None  III,IV,VI: extraocular muscles  Full ROM  V: mastication Normal  V: facial light touch sensation  Normal  V,VII: corneal reflex  Present  VII: facial muscle function - upper  Normal  VII: facial muscle function - lower Normal  VIII: hearing Not tested  IX: soft palate elevation  Normal  IX,X: gag reflex Present  XI: trapezius strength  5/5  XI: sternocleidomastoid strength 5/5  XI: neck flexion strength  5/5  XII:  tongue strength  Normal    Data Review Lab Results  Component Value Date   WBC 7.5 01/03/2024   HGB 14.7 01/03/2024   HCT 43.6 01/03/2024   MCV 101.2 (H) 01/03/2024   PLT 145 (L) 01/03/2024   Lab Results  Component Value Date   NA 140 01/03/2024   K 3.6 01/03/2024   CL 103 01/03/2024   CO2 28 01/03/2024   BUN 12 01/03/2024   CREATININE 0.90 01/03/2024   GLUCOSE 114 (H) 01/03/2024   No results found for: INR, PROTIME  Assessment/Plan: Lumbar spondylolisthesis, lumbar spinal stenosis, neurogenic claudication, lumbar radiculopathy: I have discussed situation with the patient and his wife.  I reviewed his imaging studies with him and pointed out the abnormalities.  We have discussed the various treatment options including surgery.  I have described the surgical treatment option of an L4-5 decompression instrumentation and fusion and right  L5-S1 laminotomy/foraminotomies.  I have shown him surgical models.  I have given him a surgical pamphlet.  We have discussed the risk, benefits, alternatives, expected postop course, and likelihood of achieving our goals with surgery.  I have answered all his questions.  He has decided to proceed with surgery.   [name] 01/08/2024 11:41 AM

## 2024-01-08 NOTE — Anesthesia Procedure Notes (Signed)
 Arterial Line Insertion Start/End10/20/2025 10:25 AM, 01/08/2024 10:29 AM Performed by: Jama Powell NOVAK, CRNA, CRNA  Patient location: Pre-op. Preanesthetic checklist: patient identified, IV checked, site marked, risks and benefits discussed, surgical consent, monitors and equipment checked, pre-op evaluation, timeout performed and anesthesia consent Lidocaine  1% used for infiltration Left, radial was placed Hand hygiene performed  and maximum sterile barriers used  Allen's test indicative of satisfactory collateral circulation Attempts: 2 Following insertion, Biopatch and dressing applied. Post procedure assessment: normal  Patient tolerated the procedure well with no immediate complications.

## 2024-01-08 NOTE — Transfer of Care (Signed)
 Immediate Anesthesia Transfer of Care Note  Patient: Frank Zuniga  Procedure(s) Performed: POSTERIOR LUMBAR FUSION,LUMBAR FOUR-FIVE, RIGHT LUMBAR FIVE - SACRAL ONE LAMINOTOMY (Spine Lumbar)  Patient Location: PACU  Anesthesia Type:General  Level of Consciousness: drowsy, patient cooperative, and lethargic  Airway & Oxygen Therapy: Patient Spontanous Breathing and Patient connected to face mask oxygen  Post-op Assessment: Report given to RN and Post -op Vital signs reviewed and stable  Post vital signs: Reviewed and stable  Last Vitals:  Vitals Value Taken Time  BP 121/86 01/08/24 17:01  Temp    Pulse 89 01/08/24 17:01  Resp 19 01/08/24 17:01  SpO2 97 % 01/08/24 17:01  Vitals shown include unfiled device data.  Last Pain:  Vitals:   01/08/24 0949  TempSrc: Oral         Complications: No notable events documented.

## 2024-01-08 NOTE — Op Note (Signed)
 Brief history: The patient is an 83 year old white male who has had previous lumbar surgery.  He is complaining of back and left greater right leg pain consistent with neurogenic claudication.  He failed medical management and was worked up with lumbar x-rays and lumbar MRI which demonstrated an L4-5 spondylolisthesis with severe stenosis and lateral stenosis at L5-S1.  I discussed the various treatment options with him.  He has decided proceed with surgery.  Preoperative diagnosis: Lumbar spondylolisthesis, lumbar facet arthropathy degenerative disc disease, spinal stenosis compressing both the L4, L5 and S1 nerve roots; lumbago; lumbar radiculopathy; neurogenic claudication  Postoperative diagnosis: The same  Procedure: Redo bilateral L4-5 and right L5-S1 laminotomy/foraminotomies laminotomy/foraminotomies/medial facetectomy to decompress the bilateral L4, L5 and right S1 nerve roots(the work required to do this was in addition to the work required to do the posterior lumbar interbody fusion because of the patient's spinal stenosis, facet arthropathy. Etc. requiring a wide decompression of the nerve roots.); left L4-5 transforaminal lumbar interbody fusion with local morselized autograft bone and Zimmer DBM; insertion of interbody prosthesis at L4-5 (globus peek expandable interbody prosthesis); posterior nonsegmental instrumentation from L4 to L5 with globus titanium pedicle screws and rods; posterior lateral arthrodesis at L4-5 with local morselized autograft bone and Zimmer DBM.  Surgeon: Dr. Chyrl Budge  Asst.: Duwaine Beck, NP  Anesthesia: Gen. endotracheal  Estimated blood loss: 300 cc  Drains: Hemovac drain in the epidural space  Complications: Durotomy  Description of procedure: The patient was brought to the operating room by the anesthesia team. General endotracheal anesthesia was induced. The patient was turned to the prone position on the Wilson frame. The patient's lumbosacral  region was then prepared with Betadine scrub and Betadine solution. Sterile drapes were applied.  I then injected the area to be incised with Marcaine with epinephrine solution. I then used the scalpel to make a linear midline incision over the L4-5 and L5-S1 interspace, incising through the old surgical scar. I then used electrocautery to perform a bilateral subperiosteal dissection exposing the spinous process and lamina of 3 4, and the facets at L4-5 and L5-S1. We then obtained intraoperative radiograph to confirm our location. We then inserted the Verstrac retractor to provide exposure.  I began the decompression by using the high speed drill to perform redo laminotomies at L4-5 bilaterally and a right L5-S1 laminotomy. We then used the Kerrison punches to widen the laminotomy and removed the epidural scar tissue and the ligamentum flavum at L4-5 bilaterally and L5-S1 on the right. We used the Kerrison punches to remove the  facets at L4-5 bilaterally and the medial L5-S1 facet on the right. We performed wide foraminotomies about the bilateral L4, L5 and S1 nerve roots completing the decompression.  Of note there was severe central and foraminal stenosis as expected at L4-5.  I created a small dorsal durotomy at L4-5 on the left.  I closed it with a single 6-0 Prolene suture.  We now turned our attention to the posterior lumbar interbody fusion. I used a scalpel to incise the intervertebral disc at L4-5 bilaterally. I then performed a partial intervertebral discectomy at L4-5 bilaterally using the pituitary forceps. We prepared the vertebral endplates at L4-5 bilaterally for the fusion by removing the soft tissues with the curettes. We then used the trial spacers to pick the appropriate sized interbody prosthesis. We prefilled his prosthesis with a combination of local morselized autograft bone that we obtained during the decompression as well as Zimmer DBM. We inserted the  prefilled prosthesis into the  interspace at L4-5 from the left, we then turned and expanded the prosthesis. There was a good snug fit of the prosthesis in the interspace. We then filled and the remainder of the intervertebral disc space with local morselized autograft bone and Zimmer DBM. This completed the posterior lumbar interbody arthrodesis.  During the decompression and insertion of the prosthesis the assistant protected the thecal sac and nerve roots with the D'Errico retractor.  We now turned attention to the instrumentation. Under fluoroscopic guidance we cannulated the bilateral L4 and L5 pedicles with the bone probe. We then removed the bone probe. We then tapped the pedicle with a 6.5 millimeter tap. We then removed the tap. We probed inside the tapped pedicle with a ball probe to rule out cortical breaches. We then inserted a 7.5 x 50 millimeter pedicle screw into the L4 and L5 pedicles bilaterally under fluoroscopic guidance. We then palpated along the medial aspect of the pedicles to rule out cortical breaches. There were none. The nerve roots were not injured. We then connected the unilateral pedicle screws with a lordotic rod. We compressed the construct and secured the rod in place with the caps. We then tightened the caps appropriately. This completed the instrumentation from L4-5 bilaterally.  We now turned our attention to the posterior lateral arthrodesis at L4-5. We used the high-speed drill to decorticate the remainder of the facets, pars, transverse process at L4-5. We then applied a combination of local morselized autograft bone and Zimmer DBM over these decorticated posterior lateral structures. This completed the posterior lateral arthrodesis.  We then obtained hemostasis using bipolar electrocautery. We irrigated the wound out with vashe solution. We inspected the thecal sac and nerve roots and noted they were well decompressed. We then removed the retractor.  We placed a medium Hemovac drain in the epidural  space and tunneled it out through a separate stab wound.. We reapproximated patient's thoracolumbar fascia with interrupted #1 Vicryl suture. We reapproximated patient's subcutaneous tissue with interrupted 2-0 Vicryl suture. The reapproximated patient's skin with Steri-Strips and benzoin. The wound was then coated with bacitracin ointment. A sterile dressing was applied. The drapes were removed. The patient was subsequently returned to the supine position where they were extubated by the anesthesia team He was then transported to the post anesthesia care unit in stable condition..  Of note, while the 6-0 Prolene needle was in the [place] needle holder, the needle holder was dropped on the floor and the needle popped out.  We never could find the needle.  I looked in the wound and did not see it.  Therefore the needle count was incorrect.

## 2024-01-08 NOTE — Plan of Care (Signed)

## 2024-01-08 NOTE — Anesthesia Procedure Notes (Signed)
 Procedure Name: Intubation Date/Time: 01/08/2024 12:02 PM  Performed by: Genny Gun, CRNAPre-anesthesia Checklist: Patient identified, Emergency Drugs available, Suction available, Patient being monitored and Timeout performed Patient Re-evaluated:Patient Re-evaluated prior to induction Oxygen Delivery Method: Circle system utilized Preoxygenation: Pre-oxygenation with 100% oxygen Induction Type: IV induction Ventilation: Mask ventilation without difficulty Laryngoscope Size: Mac and 4 Grade View: Grade I Tube type: Oral Tube size: 7.5 mm Number of attempts: 1 Airway Equipment and Method: Stylet Placement Confirmation: ETT inserted through vocal cords under direct vision, positive ETCO2, CO2 detector and breath sounds checked- equal and bilateral Secured at: 23 cm Tube secured with: Tape Dental Injury: Teeth and Oropharynx as per pre-operative assessment

## 2024-01-08 NOTE — Progress Notes (Signed)
 Orthopedic Tech Progress Note Patient Details:  Frank Zuniga April 29, 1940 [id number]  Ortho Devices Type of Ortho Device: Lumbar corsett Ortho Device/Splint Interventions: Ordered      [name] 01/08/2024, 8:05 PM

## 2024-01-09 DIAGNOSIS — M4316 Spondylolisthesis, lumbar region: Secondary | ICD-10-CM | POA: Diagnosis not present

## 2024-01-09 LAB — GLUCOSE, CAPILLARY: Glucose-Capillary: 153 mg/dL — ABNORMAL HIGH (ref 70–99)

## 2024-01-09 MED FILL — Thrombin For Soln 5000 Unit: CUTANEOUS | Qty: 5000 | Status: AC

## 2024-01-09 NOTE — Progress Notes (Signed)
 Subjective: The patient is alert and pleasant.  His back is sore as expected.  He looks well.  He has ambulated and urinated.  Objective: Vital signs in last 24 hours: Temp:  [97.5 F (36.4 C)-98.6 F (37 C)] 98.3 F (36.8 C) (10/21 0301) Pulse Rate:  [71-95] 71 (10/21 0301) Resp:  [13-18] 18 (10/21 0301) BP: (110-165)/(53-90) 120/67 (10/21 0301) SpO2:  [92 %-100 %] 93 % (10/21 0301) Arterial Line BP: (154-162)/(57-59) 154/57 (10/20 1715) Weight:  [69.3 kg] 69.3 kg (10/20 1019) Estimated body mass index is 25.43 kg/m as calculated from the following:   Height as of this encounter: 5' 5 (1.651 m).   Weight as of this encounter: 69.3 kg.   Intake/Output from previous day: 10/20 0701 - 10/21 0700 In: 3240 [P.O.:240; I.V.:2000; Blood:150; IV Piggyback:850] Out: 2235 [Urine:1585; Drains:400; Blood:250] Intake/Output this shift: No intake/output data recorded.  Physical exam the patient is alert and pleasant.  He is moving his lower extremities well.  Lab Results: No results for input(s): WBC, HGB, HCT, PLT in the last 72 hours. BMET No results for input(s): NA, K, CL, CO2, GLUCOSE, BUN, CREATININE, CALCIUM in the last 72 hours.  Studies/Results: DG Lumbar Spine 2-3 Views Result Date: 01/08/2024 CLINICAL DATA:  L4-5 fusion EXAM: LUMBAR SPINE - 2-3 VIEW COMPARISON:  Intraoperative localization film FLUOROSCOPY TIME:  Radiation Exposure Index (as provided by the fluoroscopic device): 9.03 mGy If the device does not provide the exposure index: Fluoroscopy Time:  17 seconds Number of Acquired Images:  2 FINDINGS: Interbody fusion is noted at L4-5 with pedicle screw placement. IMPRESSION: L4-5 fusion Electronically Signed   By: Oneil Devonshire M.D.   On: 01/08/2024 21:10   DG Lumbar Spine 1 View Result Date: 01/08/2024 CLINICAL DATA:  Intraoperative localization EXAM: LUMBAR SPINE - 1 VIEW COMPARISON:  Lumbar spine radiograph dated 03/16/2023 FINDINGS: Single  cross-table lateral view of the lumbar spine demonstrates posterior approach metallic probe at the level of L3-4. 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 IMPRESSION: Single cross-table lateral view of the lumbar spine demonstrates posterior approach metallic probe at the level of L3-4. Electronically Signed   By: Limin  Xu M.D.   On: 01/08/2024 17:26   DG C-Arm 1-60 Min-No Report Result Date: 01/08/2024 Fluoroscopy was utilized by the requesting physician.  No radiographic interpretation.    Assessment/Plan: Postop day 1: The patient is doing well.  We will mobilize him with PT.  He will likely go home later on today.  I gave him his discharge instructions and answered all his questions.  LOS: 0 days     Frank Zuniga 01/09/2024, 7:38 AM     Patient ID: Frank Zuniga, male   DOB: Jul 17, 1940, 83 y.o.   MRN: [id number]

## 2024-01-09 NOTE — Anesthesia Postprocedure Evaluation (Signed)
 Anesthesia Post Note  Patient: ACEL NATZKE  Procedure(s) Performed: POSTERIOR LUMBAR FUSION,LUMBAR FOUR-FIVE, RIGHT LUMBAR FIVE - SACRAL ONE LAMINOTOMY (Spine Lumbar)     Patient location during evaluation: PACU Anesthesia Type: General Level of consciousness: awake and alert Pain management: pain level controlled Vital Signs Assessment: post-procedure vital signs reviewed and stable Respiratory status: spontaneous breathing, nonlabored ventilation, respiratory function stable and patient connected to nasal cannula oxygen Cardiovascular status: blood pressure returned to baseline and stable Postop Assessment: no apparent nausea or vomiting Anesthetic complications: no   No notable events documented.  Last Vitals:  Vitals:   01/09/24 0301 01/09/24 0808  BP: 120/67 (!) 115/53  Pulse: 71 85  Resp: 18 17  Temp: 36.8 C 36.7 C  SpO2: 93% 95%    Last Pain:  Vitals:   01/09/24 0808  TempSrc: Oral  PainSc:                  Thom JONELLE Peoples

## 2024-01-09 NOTE — Evaluation (Signed)
 Occupational Therapy Evaluation Patient Details Name: Frank Zuniga MRN: [id number] DOB: [birth year] Today's Date: 01/09/2024   History of Present Illness   Pt is an 83 y/o M admitted to [facility] on 01/08/24 for L4-L5, L5-S1 PLIF. PMHx: AS, prostate CA.     Clinical Impressions Pt greeted in supine, agreeable for OT visit. AOX4. PTA, pt was ambulatory with rollator and indep with ADLs, IADLs, and driving. Lives with his wife whom can provide assist upon d/c. Functionally, he required min A for bed mobility and no more than CGA for functional transfers + ambulation with RW. Needed min A for LB and UB dressing and CGA for toileting/grooming ADLs. Fair compliance with spine precautions and handout provided.   Patient is cleared for d/c from an OT standpoint. No post-acute OT needs at this time.      If plan is discharge home, recommend the following:   A little help with bathing/dressing/bathroom;Assist for transportation     Functional Status Assessment         Equipment Recommendations   None recommended by OT     Recommendations for Other Services   PT consult (per orderset)     Precautions/Restrictions   Precautions Precautions: Back;Fall Precaution Booklet Issued: Yes (comment) Recall of Precautions/Restrictions: Intact Precaution/Restrictions Comments: intermittent cueing to maintain throughout Required Braces or Orthoses: Spinal Brace Spinal Brace: Lumbar corset;Applied in sitting position Restrictions Weight Bearing Restrictions Per Provider Order: No     Mobility Bed Mobility Overal bed mobility: Needs Assistance Bed Mobility: Rolling, Sidelying to Sit Rolling: Contact guard assist Sidelying to sit: Min assist       General bed mobility comments: assist for trunk elevation to transition EOB, cues for log roll technique    Transfers Overall transfer level: Needs assistance Equipment used: Rolling walker (2 wheels) Transfers: Sit to/from Stand, Bed  to chair/wheelchair/BSC Sit to Stand: Contact guard assist     Step pivot transfers: Contact guard assist     General transfer comment: Stood from bed with cues for hand placement for optimal body mechanics.      Balance Overall balance assessment: Mild deficits observed, not formally tested                                         ADL either performed or assessed with clinical judgement   ADL Overall ADL's : Needs assistance/impaired Eating/Feeding: Independent   Grooming: Contact guard assist;Standing;Wash/dry hands           Upper Body Dressing : Minimal assistance;Sitting;Cueing for sequencing Upper Body Dressing Details (indicate cue type and reason): donning/doffing LSO brace Lower Body Dressing: Minimal assistance;Cueing for compensatory techniques;Cueing for back precautions;Sitting/lateral leans;Sit to/from stand Lower Body Dressing Details (indicate cue type and reason): assist for initiating threading feet through leg holes - donned underwear and jogger sweatpants via modified figure four technique Toilet Transfer: Supervision/safety;Ambulation;Regular Toilet;Rolling walker (2 wheels)   Toileting- Clothing Manipulation and Hygiene: Contact guard assist;Sit to/from stand Toileting - Clothing Manipulation Details (indicate cue type and reason): stood to urinate in urinal, assist for managing UB clothing in stance     Functional mobility during ADLs: Contact guard assist;Rolling walker (2 wheels)       Vision Baseline Vision/History: 1 Wears glasses Ability to See in Adequate Light: 0 Adequate Patient Visual Report: No change from baseline       Perception  Praxis         Pertinent Vitals/Pain Pain Assessment Pain Assessment: 0-10 Pain Score: 5  Pain Location: back Pain Descriptors / Indicators: Discomfort Pain Intervention(s): Limited activity within patient's tolerance, Monitored during session, Repositioned      Extremity/Trunk Assessment Upper Extremity Assessment Upper Extremity Assessment: Generalized weakness   Lower Extremity Assessment Lower Extremity Assessment: Defer to PT evaluation   Cervical / Trunk Assessment Cervical / Trunk Assessment: Back Surgery   Communication Communication Communication: No apparent difficulties   Cognition Arousal: Alert Behavior During Therapy: WFL for tasks assessed/performed Cognition: No apparent impairments             OT - Cognition Comments: pleasant & participatory                 Following commands: Intact       Cueing  General Comments   Cueing Techniques: Verbal cues;Visual cues  hemovac drain intact   Exercises     Shoulder Instructions      Home Living Family/patient expects to be discharged to:: Private residence Living Arrangements: Spouse/significant other Available Help at Discharge: Family;Available 24 hours/day Type of Home: House Home Access: Level entry     Home Layout: One level     Bathroom Shower/Tub: Producer, television/film/video: Standard Bathroom Accessibility: Yes How Accessible: Accessible via walker (RW) Home Equipment: Rolling Walker (2 wheels);Rollator (4 wheels);Cane - single point          Prior Functioning/Environment Prior Level of Function : Independent/Modified Independent;Driving             Mobility Comments: rollator for all household distances PTA ADLs Comments: indep with ADLs & IADLs    OT Problem List: Decreased strength;Decreased activity tolerance;Pain   OT Treatment/Interventions:        OT Goals(Current goals can be found in the care plan section)   Acute Rehab OT Goals Patient Stated Goal: go home OT Goal Formulation: With patient   OT Frequency:       Co-evaluation              AM-PAC OT 6 Clicks Daily Activity     Outcome Measure Help from another person eating meals?: None Help from another person taking care of personal  grooming?: A Little Help from another person toileting, which includes using toliet, bedpan, or urinal?: A Little Help from another person bathing (including washing, rinsing, drying)?: A Little Help from another person to put on and taking off regular upper body clothing?: A Little Help from another person to put on and taking off regular lower body clothing?: A Little 6 Click Score: 19   End of Session Equipment Utilized During Treatment: Gait belt;Rolling walker (2 wheels);Back brace Nurse Communication: Mobility status  Activity Tolerance: Patient tolerated treatment well Patient left: in chair;with call bell/phone within reach  OT Visit Diagnosis: Unsteadiness on feet (R26.81);Other abnormalities of gait and mobility (R26.89)                Time: 9164-9094 OT Time Calculation (min): 30 min Charges:  OT General Charges $OT Visit: 1 Visit OT Evaluation $OT Eval Low Complexity: 1 Low OT Treatments $Self Care/Home Management : 8-22 mins  Tyjuan Demetro D., MSOT, OTR/L Acute Rehabilitation Services [phone] Secure Chat Preferred  Rikki Milch 01/09/2024, 9:52 AM

## 2024-01-09 NOTE — Evaluation (Signed)
 Physical Therapy Evaluation  Patient Details Name: Frank Zuniga MRN: [id number] DOB: 1940-05-18 Today's Date: 01/09/2024  History of Present Illness  Pt is an 83 y/o M admitted to [facility] on 01/08/24 for L4-L5, L5-S1 PLIF. PMHx: AS, prostate CA.  Clinical Impression  Pt admitted with above diagnosis. At the time of PT eval, pt was able to demonstrate transfers and ambulation with gross CGA to min assist and RW for support. Pt was educated on precautions, brace application/wearing schedule, appropriate activity progression, and car transfer. Pt currently with functional limitations due to the deficits listed below (see PT Problem List). Pt will benefit from skilled PT to increase their independence and safety with mobility to allow discharge to the venue listed below.          If plan is discharge home, recommend the following: A little help with walking and/or transfers;A little help with bathing/dressing/bathroom;Assistance with cooking/housework;Assist for transportation;Help with stairs or ramp for entrance   Can travel by private vehicle        Equipment Recommendations None recommended by PT  Recommendations for Other Services       Functional Status Assessment Patient has had a recent decline in their functional status and demonstrates the ability to make significant improvements in function in a reasonable and predictable amount of time.     Precautions / Restrictions Precautions Precautions: Back;Fall Precaution Booklet Issued: Yes (comment) Recall of Precautions/Restrictions: Intact Precaution/Restrictions Comments: Reviewed handout and pt was cued for precautions during functional mobility. Required Braces or Orthoses: Spinal Brace Spinal Brace: Lumbar corset;Applied in sitting position Restrictions Weight Bearing Restrictions Per Provider Order: No      Mobility  Bed Mobility Overal bed mobility: Needs Assistance Bed Mobility: Rolling, Sidelying to Sit, Sit to  Sidelying Rolling: Contact guard assist Sidelying to sit: Min assist     Sit to sidelying: Min assist General bed mobility comments: VC's for optimal log roll technique. Assist for LE management.    Transfers Overall transfer level: Needs assistance Equipment used: Rolling walker (2 wheels) Transfers: Sit to/from Stand Sit to Stand: Contact guard assist           General transfer comment: VC's for hand placement on seated surface for safety.    Ambulation/Gait Ambulation/Gait assistance: Contact guard assist Gait Distance (Feet): 200 Feet Assistive device: Rolling walker (2 wheels) Gait Pattern/deviations: Step-through pattern, Decreased stride length, Trunk flexed Gait velocity: Decreased Gait velocity interpretation: 1.31 - 2.62 ft/sec, indicative of limited community ambulator   General Gait Details: VC's for improved posture, closer walker proximity, and forward gaze. No assist required but hands on guarding provided for safety.  Stairs            Wheelchair Mobility     Tilt Bed    Modified Rankin (Stroke Patients Only)       Balance Overall balance assessment: Mild deficits observed, not formally tested                                           Pertinent Vitals/Pain Pain Assessment Pain Assessment: Faces Faces Pain Scale: Hurts little more Pain Location: back Pain Descriptors / Indicators: Operative site guarding, Sore Pain Intervention(s): Limited activity within patient's tolerance, Monitored during session, Repositioned    Home Living Family/patient expects to be discharged to:: Private residence Living Arrangements: Spouse/significant other Available Help at Discharge: Family;Available 24 hours/day Type of Home:  House Home Access: Level entry       Home Layout: One level Home Equipment: Agricultural consultant (2 wheels);Rollator (4 wheels);Cane - single point      Prior Function Prior Level of Function :  Independent/Modified Independent;Driving             Mobility Comments: rollator for all household distances PTA ADLs Comments: indep with ADLs & IADLs     Extremity/Trunk Assessment   Upper Extremity Assessment Upper Extremity Assessment: Defer to OT evaluation    Lower Extremity Assessment Lower Extremity Assessment: Generalized weakness;LLE deficits/detail LLE Deficits / Details: Mild drop foot noted.    Cervical / Trunk Assessment Cervical / Trunk Assessment: Back Surgery  Communication   Communication Communication: No apparent difficulties    Cognition Arousal: Alert Behavior During Therapy: WFL for tasks assessed/performed   PT - Cognitive impairments: No apparent impairments                         Following commands: Intact       Cueing Cueing Techniques: Verbal cues, Visual cues     General Comments      Exercises     Assessment/Plan    PT Assessment Patient needs continued PT services  PT Problem List Decreased strength;Decreased activity tolerance;Decreased balance;Decreased mobility;Decreased knowledge of use of DME;Decreased safety awareness;Decreased knowledge of precautions;Pain       PT Treatment Interventions DME instruction;Gait training;Functional mobility training;Therapeutic activities;Balance training;Therapeutic exercise;Patient/family education    PT Goals (Current goals can be found in the Care Plan section)  Acute Rehab PT Goals Patient Stated Goal: Home tomorrow PT Goal Formulation: With patient Time For Goal Achievement: 01/16/24 Potential to Achieve Goals: Good    Frequency Min 5X/week     Co-evaluation               AM-PAC PT 6 Clicks Mobility  Outcome Measure Help needed turning from your back to your side while in a flat bed without using bedrails?: A Little Help needed moving from lying on your back to sitting on the side of a flat bed without using bedrails?: A Little Help needed moving to  and from a bed to a chair (including a wheelchair)?: A Little Help needed standing up from a chair using your arms (e.g., wheelchair or bedside chair)?: A Little Help needed to walk in hospital room?: A Little Help needed climbing 3-5 steps with a railing? : A Little 6 Click Score: 18    End of Session Equipment Utilized During Treatment: Gait belt;Back brace Activity Tolerance: Patient tolerated treatment well Patient left: in bed;with call bell/phone within reach Nurse Communication: Mobility status PT Visit Diagnosis: Unsteadiness on feet (R26.81);Pain Pain - part of body:  (back)    Time: 8850-8795 PT Time Calculation (min) (ACUTE ONLY): 15 min   Charges:   PT Evaluation $PT Eval Low Complexity: 1 Low   PT General Charges $$ ACUTE PT VISIT: 1 Visit         Leita Sable, PT, DPT Acute Rehabilitation Services Secure Chat Preferred Office: [phone]   [name] 01/09/2024, 2:01 PM

## 2024-01-10 DIAGNOSIS — M4316 Spondylolisthesis, lumbar region: Secondary | ICD-10-CM | POA: Diagnosis not present

## 2024-01-10 MED ORDER — POLYETHYLENE GLYCOL 3350 17 G PO PACK: 17.0000 g | PACK | Freq: Every day | ORAL | 0 refills | Status: AC

## 2024-01-10 MED ORDER — MUPIROCIN 2 % EX OINT
TOPICAL_OINTMENT | Freq: Two times a day (BID) | CUTANEOUS | Status: DC
  Filled 2024-01-10: qty 22

## 2024-01-10 MED ORDER — CYCLOBENZAPRINE HCL 5 MG PO TABS: 5.0000 mg | ORAL_TABLET | Freq: Three times a day (TID) | ORAL | 0 refills | Status: DC | PRN

## 2024-01-10 MED ORDER — OXYCODONE-ACETAMINOPHEN 5-325 MG PO TABS: 1.0000 | ORAL_TABLET | ORAL | 0 refills | Status: DC | PRN

## 2024-01-10 MED FILL — Sodium Chloride IV Soln 0.9%: INTRAVENOUS | Qty: 2000 | Status: AC

## 2024-01-10 MED FILL — Heparin Sodium (Porcine) Inj 1000 Unit/ML: INTRAMUSCULAR | Qty: 30 | Status: AC

## 2024-01-10 NOTE — Progress Notes (Signed)
 Patient alert and oriented, ambulated, voided. D/c instructions explain and given to the patient and family. All questions answered. Surgical site clean and dry no sign of infection.

## 2024-01-10 NOTE — Discharge Instructions (Signed)

## 2024-01-10 NOTE — Progress Notes (Signed)
 Subjective: The patient is alert and pleasant.  His back is appropriately sore.     Objective: Vital signs in last 24 hours: Temp:  [97.8 F (36.6 C)-98.4 F (36.9 C)] 97.8 F (36.6 C) (10/22 0739) Pulse Rate:  [57-98] 97 (10/22 0739) Resp:  [17-18] 17 (10/22 0739) BP: (114-152)/(52-77) 120/67 (10/22 0739) SpO2:  [93 %-99 %] 97 % (10/22 0739) Estimated body mass index is 25.43 kg/m as calculated from the following:   Height as of this encounter: 5' 5 (1.651 m).   Weight as of this encounter: 69.3 kg.   Intake/Output from previous day: 10/21 0701 - 10/22 0700 In: 960 [P.O.:960] Out: 280 [Drains:280] Intake/Output this shift: No intake/output data recorded.  Physical exam the patient is alert and oriented.  His strength is normal.  Lab Results: No results for input(s): WBC, HGB, HCT, PLT in the last 72 hours. BMET No results for input(s): NA, K, CL, CO2, GLUCOSE, BUN, CREATININE, CALCIUM in the last 72 hours.  Studies/Results: DG Lumbar Spine 2-3 Views Result Date: 01/08/2024 CLINICAL DATA:  L4-5 fusion EXAM: LUMBAR SPINE - 2-3 VIEW COMPARISON:  Intraoperative localization film FLUOROSCOPY TIME:  Radiation Exposure Index (as provided by the fluoroscopic device): 9.03 mGy If the device does not provide the exposure index: Fluoroscopy Time:  17 seconds Number of Acquired Images:  2 FINDINGS: Interbody fusion is noted at L4-5 with pedicle screw placement. IMPRESSION: L4-5 fusion Electronically Signed   By: Oneil Devonshire M.D.   On: 01/08/2024 21:10   DG Lumbar Spine 1 View Result Date: 01/08/2024 CLINICAL DATA:  Intraoperative localization EXAM: LUMBAR SPINE - 1 VIEW COMPARISON:  Lumbar spine radiograph dated 03/16/2023 FINDINGS: Single cross-table lateral view of the lumbar spine demonstrates posterior approach metallic probe at the level of L3-4. 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 IMPRESSION: Single  cross-table lateral view of the lumbar spine demonstrates posterior approach metallic probe at the level of L3-4. Electronically Signed   By: Limin  Xu M.D.   On: 01/08/2024 17:26   DG C-Arm 1-60 Min-No Report Result Date: 01/08/2024 Fluoroscopy was utilized by the requesting physician.  No radiographic interpretation.    Assessment/Plan: Postop day #2: The will likely go home later on today.  I have answered all his questions.  LOS: 0 days     [name] 01/10/2024, 7:59 AM     Patient ID: Frank Zuniga, male   DOB: 1940-07-06, 83 y.o.   MRN: [id number]

## 2024-01-10 NOTE — Discharge Summary (Signed)
 Physician Discharge Summary     Providing Compassionate, Quality Care - Together   Patient ID: Frank Zuniga MRN: [id number] DOB/AGE: 1941/01/30 83 y.o.  Admit date: 01/08/2024 Discharge date: 01/10/2024  Admission Diagnoses: Lumbar spondylolisthesis  Discharge Diagnoses:  Principal Problem:   Spondylolisthesis of lumbar region   Discharged Condition: good  Hospital Course: Patient underwent an L4-5 decompression and fusion, with right L5-S1 laminotomy/foraminotomies by Dr. Mavis on 01/08/2024. He was admitted to 3C05 following recovery from anesthesia in the PACU. His postoperative course has been uncomplicated. He has worked with both physical and occupational therapies who feel the patient is ready for discharge home. He is ambulating independently with a rolling walker. He is tolerating a normal diet. He is not having any bowel or bladder dysfunction. His pain is well-controlled with oral pain medication. He is ready for discharge home.   Consults: PT/OT/TOC  Significant Diagnostic Studies: radiology: DG Lumbar Spine 2-3 Views Result Date: 01/08/2024 CLINICAL DATA:  L4-5 fusion EXAM: LUMBAR SPINE - 2-3 VIEW COMPARISON:  Intraoperative localization film FLUOROSCOPY TIME:  Radiation Exposure Index (as provided by the fluoroscopic device): 9.03 mGy If the device does not provide the exposure index: Fluoroscopy Time:  17 seconds Number of Acquired Images:  2 FINDINGS: Interbody fusion is noted at L4-5 with pedicle screw placement. IMPRESSION: L4-5 fusion Electronically Signed   By: Oneil Devonshire M.D.   On: 01/08/2024 21:10   DG Lumbar Spine 1 View Result Date: 01/08/2024 CLINICAL DATA:  Intraoperative localization EXAM: LUMBAR SPINE - 1 VIEW COMPARISON:  Lumbar spine radiograph dated 03/16/2023 FINDINGS: Single cross-table lateral view of the lumbar spine demonstrates posterior approach metallic probe at the level of L3-4. Multilevel degenerative changes of the lumbar spine with  grade 2 anterolisthesis at L4-5. IMPRESSION: Single cross-table lateral view of the lumbar spine demonstrates posterior approach metallic probe at the level of L3-4. Electronically Signed   By: Limin  Xu M.D.   On: 01/08/2024 17:26   DG C-Arm 1-60 Min-No Report Result Date: 01/08/2024 Fluoroscopy was utilized by the requesting physician.  No radiographic interpretation.     Treatments: surgery:  Redo bilateral L4-5 and right L5-S1 laminotomy/foraminotomies laminotomy/foraminotomies/medial facetectomy to decompress the bilateral L4, L5 and right S1 nerve roots(the work required to do this was in addition to the work required to do the posterior lumbar interbody fusion because of the patient's spinal stenosis, facet arthropathy. Etc. requiring a wide decompression of the nerve roots.); left L4-5 transforaminal lumbar interbody fusion with local morselized autograft bone and Zimmer DBM; insertion of interbody prosthesis at L4-5 (globus peek expandable interbody prosthesis); posterior nonsegmental instrumentation from L4 to L5 with globus titanium pedicle screws and rods; posterior lateral arthrodesis at L4-5 with local morselized autograft bone and Zimmer DBM.   Discharge Exam: Blood pressure 120/67, pulse 97, temperature 97.8 F (36.6 C), temperature source Oral, resp. rate 17, height 5' 5 (1.651 m), weight 69.3 kg, SpO2 97%.  Per report: Alert and oriented x 4 PERRLA CN II-XII grossly intact MAE, Strength and sensation intact Incision is covered with Honeycomb dressing and Steri Strips; Dressing is clean, dry, and intact   Disposition: Discharge disposition: 01-Home or Self Care       Discharge Instructions     Call MD for:  difficulty breathing, headache or visual disturbances   Complete by: As directed    Call MD for:  hives   Complete by: As directed    Call MD for:  persistant nausea and vomiting   Complete  by: As directed    Call MD for:  redness, tenderness, or signs of  infection (pain, swelling, redness, odor or green/yellow discharge around incision site)   Complete by: As directed    Call MD for:  severe uncontrolled pain   Complete by: As directed    Diet - low sodium heart healthy   Complete by: As directed    If the dressing is still on your incision site when you go home, remove it on the third day after your surgery date. Remove dressing if it begins to fall off, or if it is dirty or damaged before the third day.   Complete by: As directed    Increase activity slowly   Complete by: As directed       Allergies as of 01/10/2024       Reactions   Prevnar 13 [pneumococcal 13-val Conj Vacc] Rash   Fever, chills, Pain        Medication List     PAUSE taking these medications    methotrexate 50 MG/2ML injection Wait to take this until: January 19, 2024 Inject 25 mg into the muscle every Friday.       STOP taking these medications    celecoxib 200 MG capsule Commonly known as: CELEBREX       TAKE these medications    acetaminophen  500 MG tablet Commonly known as: TYLENOL  Take 500-1,000 mg by mouth every 6 (six) hours as needed (pain.).   aspirin  EC 81 MG tablet Take 81 mg by mouth daily.   Centrum Silver 50+Men Tabs Take 1 tablet by mouth daily.   cetirizine 10 MG tablet Commonly known as: ZYRTEC Take 10 mg by mouth daily.   Cholecalciferol 25 MCG (1000 UT) tablet Take 1,000 Units by mouth 2 (two) times daily.   cyclobenzaprine 5 MG tablet Commonly known as: FLEXERIL Take 1 tablet (5 mg total) by mouth 3 (three) times daily as needed for muscle spasms.   Flomax 0.4 MG Caps capsule Generic drug: tamsulosin Take 0.4 mg by mouth in the morning.   FLORAJEN3 PO Take 1 capsule by mouth daily.   folic acid 1 MG tablet Commonly known as: FOLVITE Take 1 mg by mouth daily.   hydroxychloroquine 200 MG tablet Commonly known as: PLAQUENIL Take 200 mg by mouth 2 (two) times daily.   hydroxypropyl methylcellulose /  hypromellose 2.5 % ophthalmic solution Commonly known as: ISOPTO TEARS / GONIOVISC Place 1 drop into both eyes daily as needed for dry eyes.   Krill Oil 350 MG Caps Take 350 mg by mouth in the morning.   NexIUM 40 MG capsule Generic drug: esomeprazole Take 40 mg by mouth daily before breakfast.   OVER THE COUNTER MEDICATION Take 1 tablet by mouth 2 (two) times daily. Prostate plus/ Urino Zinc   oxyCODONE-acetaminophen  5-325 MG tablet Commonly known as: Percocet Take 1-2 tablets by mouth every 4 (four) hours as needed.   polyethylene glycol 17 g packet Commonly known as: MiraLax Take 17 g by mouth daily.   predniSONE 5 MG tablet Commonly known as: DELTASONE Take 5 mg by mouth in the morning.   sulfaSALAzine 500 MG tablet Commonly known as: AZULFIDINE Take 1,000 mg by mouth 2 (two) times daily.   Super Calcium 1500 (600 Ca) MG Tabs tablet Generic drug: calcium carbonate Take 600 mg of elemental calcium by mouth 2 (two) times daily with a meal.   Tribenzor 40-5-12.5 MG Tabs Generic drug: Olmesartan-amLODIPine-HCTZ Take 1 tablet by mouth in  the morning.   VITAMIN B-3 PO Take 1 tablet by mouth in the morning.   True Vitamin B3 500 MG tablet Generic drug: niacin Take 500 mg by mouth in the morning and at bedtime.               Discharge Care Instructions  (From admission, onward)           Start     Ordered   01/10/24 0000  If the dressing is still on your incision site when you go home, remove it on the third day after your surgery date. Remove dressing if it begins to fall off, or if it is dirty or damaged before the third day.        01/10/24 1103            Follow-up Information     Mavis Purchase, MD Follow up on 02/02/2024.   Specialty: Neurosurgery Why: First post op appointment with x-rays is on 02/02/2024 at 8:30 AM. Contact information: [address] [place] [address] [phone]                  Signed: Gerard Beck, DNP, AGNP-C Nurse Practitioner  [facility] [address], [address], [place], [place] [address] P: [phone]    F: [phone]  01/10/2024, 11:03 AM

## 2024-01-10 NOTE — Progress Notes (Signed)
 Physical Therapy Treatment  Patient Details Name: Frank Zuniga MRN: [id number] DOB: 27-Nov-1940 Today's Date: 01/10/2024   History of Present Illness Pt is an 83 y/o M admitted to [facility] on 01/08/24 for L4-L5, L5-S1 PLIF. PMHx: AS, prostate CA.    PT Comments  Pt progressing with post-op mobility. He was able to demonstrate transfers and ambulation with gross CGA to min assist and RW for support. Shoes donned this session and noted pt with increased difficulty with floor clearance and heel strike. Reinforced education on precautions, brace application/wearing schedule, appropriate activity progression, and car transfer. Will continue to follow.      If plan is discharge home, recommend the following: A little help with walking and/or transfers;A little help with bathing/dressing/bathroom;Assistance with cooking/housework;Assist for transportation;Help with stairs or ramp for entrance   Can travel by private vehicle        Equipment Recommendations  None recommended by PT    Recommendations for Other Services       Precautions / Restrictions Precautions Precautions: Back;Fall Precaution Booklet Issued: Yes (comment) Recall of Precautions/Restrictions: Intact Precaution/Restrictions Comments: Reviewed handout and pt was cued for precautions during functional mobility. Required Braces or Orthoses: Spinal Brace Spinal Brace: Lumbar corset;Applied in sitting position Restrictions Weight Bearing Restrictions Per Provider Order: No     Mobility  Bed Mobility Overal bed mobility: Needs Assistance Bed Mobility: Rolling, Sit to Sidelying Rolling: Contact guard assist       Sit to sidelying: Min assist General bed mobility comments: Assist for :LE elevation up into bed at end of session    Transfers Overall transfer level: Needs assistance Equipment used: Rolling walker (2 wheels) Transfers: Sit to/from Stand Sit to Stand: Contact guard assist           General transfer  comment: VC's for hand placement on seated surface for safety.    Ambulation/Gait Ambulation/Gait assistance: Contact guard assist Gait Distance (Feet): 150 Feet Assistive device: Rolling walker (2 wheels) Gait Pattern/deviations: Step-through pattern, Decreased stride length, Trunk flexed Gait velocity: Decreased Gait velocity interpretation: <1.31 ft/sec, indicative of household ambulator   General Gait Details: VC's for improved posture, closer walker proximity, and forward gaze. No assist required but hands on guarding provided for safety.   Stairs             Wheelchair Mobility     Tilt Bed    Modified Rankin (Stroke Patients Only)       Balance Overall balance assessment: Mild deficits observed, not formally tested                                          Communication Communication Communication: No apparent difficulties  Cognition Arousal: Alert Behavior During Therapy: WFL for tasks assessed/performed   PT - Cognitive impairments: No apparent impairments                         Following commands: Intact      Cueing Cueing Techniques: Verbal cues, Visual cues  Exercises      General Comments        Pertinent Vitals/Pain Pain Assessment Pain Assessment: Faces Faces Pain Scale: Hurts little more Pain Location: back Pain Descriptors / Indicators: Operative site guarding, Sore Pain Intervention(s): Limited activity within patient's tolerance, Monitored during session, Repositioned    Home Living  Prior Function            PT Goals (current goals can now be found in the care plan section) Acute Rehab PT Goals Patient Stated Goal: States he wants to stay another day PT Goal Formulation: With patient Time For Goal Achievement: 01/16/24 Potential to Achieve Goals: Good Progress towards PT goals: Progressing toward goals    Frequency    Min 5X/week      PT Plan       Co-evaluation              AM-PAC PT 6 Clicks Mobility   Outcome Measure  Help needed turning from your back to your side while in a flat bed without using bedrails?: A Little Help needed moving from lying on your back to sitting on the side of a flat bed without using bedrails?: A Little Help needed moving to and from a bed to a chair (including a wheelchair)?: A Little Help needed standing up from a chair using your arms (e.g., wheelchair or bedside chair)?: A Little Help needed to walk in hospital room?: A Little Help needed climbing 3-5 steps with a railing? : A Little 6 Click Score: 18    End of Session Equipment Utilized During Treatment: Gait belt;Back brace Activity Tolerance: Patient tolerated treatment well Patient left: in bed;with call bell/phone within reach Nurse Communication: Mobility status PT Visit Diagnosis: Unsteadiness on feet (R26.81);Pain Pain - part of body:  (back)     Time: 9096-9073 PT Time Calculation (min) (ACUTE ONLY): 23 min  Charges:    $Gait Training: 23-37 mins PT General Charges $$ ACUTE PT VISIT: 1 Visit                     Leita Sable, PT, DPT Acute Rehabilitation Services Secure Chat Preferred Office: [phone]    [name] 01/10/2024, 9:38 AM

## 2024-01-14 ENCOUNTER — Encounter (HOSPITAL_BASED_OUTPATIENT_CLINIC_OR_DEPARTMENT_OTHER): Payer: Self-pay

## 2024-01-14 ENCOUNTER — Ambulatory Visit (HOSPITAL_BASED_OUTPATIENT_CLINIC_OR_DEPARTMENT_OTHER): Admission: EM | Admit: 2024-01-14 | Discharge: 2024-01-14 | Disposition: A | Discharge status: Home / Self Care

## 2024-01-14 DIAGNOSIS — Z5189 Encounter for other specified aftercare: Secondary | ICD-10-CM

## 2024-01-14 NOTE — ED Provider Notes (Signed)
 [facility]    CSN: [id number] Arrival date & time: 01/14/24  1422      History   Chief Complaint Chief Complaint  Patient presents with   Surgical Site Bleeding    HPI Frank Zuniga is a 83 y.o. male.   Pt is an 83 year old male that presents for wound check. Back surgery on 10/20. States noticed drainage from incision site yesterday. Spoke to a nurse at the hospital who suggested he come to urgent care. States pain is well managed. Surgical site clean. Pink, thin, drainage to distal end of incision. No redness, no foul odor.      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, coronary CT angio 2025 10/24/2023   Heart murmur 09/19/2023   Spinal stenosis of lumbar region 09/18/2023   Foot-drop 09/18/2023   Degeneration of lumbar intervertebral disc 03/02/2021   Gastro-esophageal reflux disease with esophagitis 03/02/2021   Primary generalized (osteo)arthritis 03/02/2021   Hospital discharge follow-up 03/03/2020   Hypokalemia 03/03/2020   S/P lumbar laminectomy 02/17/2020   Facet hypertrophy of lumbar region 09/20/2019   Acute pain of right knee 12/16/2018   Primary osteoarthritis of right hip 12/16/2018   Actinic keratosis 06/26/2018   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   Environmental and seasonal allergies 06/26/2018   Lumbar back pain with radiculopathy affecting lower extremity 06/26/2018   Prostate cancer (HCC) 12/13/2016   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/13/2016   Essential hypertension  08/30/2016   Preop cardiovascular exam 08/30/2016   Conductive hearing loss in left ear 05/06/2015   Dysfunction of left eustachian tube 05/06/2015    Past Surgical History:  Procedure Laterality Date   HERNIA REPAIR     x 3   PROSTATE SURGERY     RIGHT/LEFT HEART CATH AND CORONARY ANGIOGRAPHY N/A 10/26/2023   Procedure: RIGHT/LEFT HEART CATH AND CORONARY ANGIOGRAPHY;  Surgeon: Darron Deatrice LABOR, MD;  Location: [facility] INVASIVE CV LAB;  Service: Cardiovascular;  Laterality: N/A;   TONSILLECTOMY         Home Medications    Prior to Admission medications   Medication Sig Start Date End Date Taking? Authorizing Provider  acetaminophen  (TYLENOL ) 500 MG tablet Take 500-1,000 mg by mouth every 6 (six) hours as needed (pain.).    [provider]  aspirin  81 MG EC tablet Take 81 mg by mouth daily. 02/08/20   [provider]  calcium carbonate (SUPER CALCIUM) 1500 (600 Ca) MG TABS tablet Take 600 mg of elemental calcium by mouth 2 (two) times daily with a meal. 02/08/20   [provider]  cetirizine (ZYRTEC) 10 MG tablet Take 10 mg by mouth daily.    [provider]  Cholecalciferol 25 MCG (1000 UT) tablet Take 1,000 Units by mouth 2 (two) times daily. 02/08/20   [provider]  cyclobenzaprine (FLEXERIL) 5 MG tablet Take 1 tablet (5 mg total) by mouth 3 (three) times daily as needed for muscle spasms. 01/10/24   Bergman, Meghan D, NP  esomeprazole (NEXIUM) 40 MG  capsule Take 40 mg by mouth daily before breakfast. 12/21/16   [provider]  folic acid (FOLVITE) 1 MG tablet Take 1 mg by mouth daily. 04/15/15   [provider]  hydroxychloroquine (PLAQUENIL) 200 MG tablet Take 200 mg by mouth 2 (two) times daily. 04/15/15   [provider]  hydroxypropyl methylcellulose / hypromellose (ISOPTO TEARS / GONIOVISC) 2.5 % ophthalmic solution Place 1 drop into both eyes daily as needed for dry eyes.    [provider]  Anselm Oil  350 MG CAPS Take 350 mg by mouth in the morning. 02/08/20   [provider]  methotrexate 50 MG/2ML injection Inject 25 mg into the muscle every Friday. 04/15/15   [provider]  Multiple Vitamins-Minerals (CENTRUM SILVER 50+MEN) TABS Take 1 tablet by mouth daily. 02/08/20   [provider]  niacin (TRUE VITAMIN B3) 500 MG tablet Take 500 mg by mouth in the morning and at bedtime.    [provider]  Niacin (VITAMIN B-3 PO) Take 1 tablet by mouth in the morning.    [provider]  Olmesartan-amLODIPine-HCTZ (TRIBENZOR) 40-5-12.5 MG TABS Take 1 tablet by mouth in the morning. 03/26/18   [provider]  OVER THE COUNTER MEDICATION Take 1 tablet by mouth 2 (two) times daily. Prostate plus/ Urino Zinc    [provider]  oxyCODONE-acetaminophen  (PERCOCET) 5-325 MG tablet Take 1-2 tablets by mouth every 4 (four) hours as needed. 01/10/24 01/09/25  Bergman, Meghan D, NP  polyethylene glycol (MIRALAX) 17 g packet Take 17 g by mouth daily. 01/10/24   Bergman, Meghan D, NP  predniSONE (DELTASONE) 5 MG tablet Take 5 mg by mouth in the morning. 01/30/18   [provider]  Probiotic Product (FLORAJEN3 PO) Take 1 capsule by mouth daily.    [provider]  sulfaSALAzine (AZULFIDINE) 500 MG tablet Take 1,000 mg by mouth 2 (two) times daily. 03/30/15   [provider]  tamsulosin (FLOMAX) 0.4 MG CAPS capsule Take 0.4 mg by mouth in the morning.    [provider]    Family History History reviewed. No pertinent family history.  Social History Social History   Tobacco Use   Smoking status: Former    Current packs/day: 0.00    Types: Cigarettes    Quit date: 1974    Years since quitting: 51.8   Smokeless tobacco: Never  Vaping Use   Vaping status: Never Used  Substance Use Topics   Alcohol use: Never   Drug use: Never     Allergies   Prevnar 13 [pneumococcal 13-val conj vacc]   Review of  Systems Review of Systems See HPI  Physical Exam Triage Vital Signs ED Triage Vitals  Encounter Vitals Group     BP 01/14/24 1437 123/71     Girls Systolic BP Percentile --      Girls Diastolic BP Percentile --      Boys Systolic BP Percentile --      Boys Diastolic BP Percentile --      Pulse Rate 01/14/24 1437 100     Resp 01/14/24 1437 20     Temp 01/14/24 1437 98.3 F (36.8 C)     Temp src --      SpO2 01/14/24 1437 92 %     Weight --      Height --      Head Circumference --      Peak Flow --      Pain Score 01/14/24  1439 2     Pain Loc --      Pain Education --      Exclude from Growth Chart --    No data found.  Updated Vital Signs BP 123/71 (BP Location: Left Arm)   Pulse 100   Temp 98.3 F (36.8 C)   Resp 20   SpO2 92%   Visual Acuity Right Eye Distance:   Left Eye Distance:   Bilateral Distance:    Right Eye Near:   Left Eye Near:    Bilateral Near:     Physical Exam Constitutional:      Appearance: Normal appearance.  Pulmonary:     Effort: Pulmonary effort is normal.  Musculoskeletal:        General: Normal range of motion.  Skin:    General: Skin is warm and dry.     Comments: Incision to lumbar spine. Slightly swollen without redness. Non tender. Small amount of serosanguinous fluid. Well healing. Steri strips in place.   Neurological:     Mental Status: He is alert.  Psychiatric:        Mood and Affect: Mood normal.      UC Treatments / Results  Labs (all labs ordered are listed, but only abnormal results are displayed) Labs Reviewed - No data to display  EKG   Radiology No results found.  Procedures Procedures (including critical care time)  Medications Ordered in UC Medications - No data to display  Initial Impression / Assessment and Plan / UC Course  I have reviewed the triage vital signs and the nursing notes.  Pertinent labs & imaging results that were available during my care of the patient were reviewed by me  and considered in my medical decision making (see chart for details).     Wound check-incision appears to be healing well.  There is some mild swelling generalized around the area but otherwise there is no redness, pain, odor or purulent drainage.  There was normal drainage from the area.  No concerns for infection at this time.  Dressing changed here in clinic today.  Recommend if symptoms change or worsen will need to follow-up with the surgeon. Final Clinical Impressions(s) / UC Diagnoses   Final diagnoses:  Visit for wound check     Discharge Instructions      Keep the area clean and dry. No concerns on exam today.  Follow up as needed.    ED Prescriptions   None    PDMP not reviewed this encounter.   Adah Wilbert LABOR, FNP 01/14/24 1544

## 2024-01-14 NOTE — Discharge Instructions (Signed)
 Keep the area clean and dry. No concerns on exam today.  Follow up as needed.

## 2024-01-14 NOTE — ED Triage Notes (Addendum)
 Back surgery on 10/20. States noticed drainage from incision site yesterday. Spoke to a nurse at the hospital who suggested he come to urgent care. States pain is well managed. Surgical site clean. Pink, thin, drainage to distal end of incision. No redness, no foul odor.

## 2024-01-20 ENCOUNTER — Emergency Department (HOSPITAL_COMMUNITY): Admitting: Anesthesiology

## 2024-01-20 ENCOUNTER — Inpatient Hospital Stay (HOSPITAL_COMMUNITY)
Admission: EM | Admit: 2024-01-20 | Discharge: 2024-01-26 | DRG: 858 | Disposition: A | Discharge status: Skilled Nursing Facility | Attending: Neurosurgery | Admitting: Neurosurgery

## 2024-01-20 ENCOUNTER — Encounter (HOSPITAL_COMMUNITY): Payer: Self-pay

## 2024-01-20 ENCOUNTER — Emergency Department (HOSPITAL_COMMUNITY)

## 2024-01-20 ENCOUNTER — Other Ambulatory Visit: Payer: Self-pay

## 2024-01-20 ENCOUNTER — Encounter (HOSPITAL_COMMUNITY)
Admission: EM | Disposition: A | Discharge status: Skilled Nursing Facility | Payer: Self-pay | Source: Home / Self Care | Attending: Neurosurgery

## 2024-01-20 DIAGNOSIS — Z8546 Personal history of malignant neoplasm of prostate: Secondary | ICD-10-CM

## 2024-01-20 DIAGNOSIS — S31000A Unspecified open wound of lower back and pelvis without penetration into retroperitoneum, initial encounter: Secondary | ICD-10-CM | POA: Diagnosis not present

## 2024-01-20 DIAGNOSIS — B9561 Methicillin susceptible Staphylococcus aureus infection as the cause of diseases classified elsewhere: Secondary | ICD-10-CM | POA: Diagnosis present

## 2024-01-20 DIAGNOSIS — K219 Gastro-esophageal reflux disease without esophagitis: Secondary | ICD-10-CM | POA: Diagnosis present

## 2024-01-20 DIAGNOSIS — I2510 Atherosclerotic heart disease of native coronary artery without angina pectoris: Secondary | ICD-10-CM

## 2024-01-20 DIAGNOSIS — M549 Dorsalgia, unspecified: Secondary | ICD-10-CM | POA: Diagnosis present

## 2024-01-20 DIAGNOSIS — W1830XA Fall on same level, unspecified, initial encounter: Secondary | ICD-10-CM | POA: Diagnosis present

## 2024-01-20 DIAGNOSIS — I10 Essential (primary) hypertension: Secondary | ICD-10-CM | POA: Diagnosis present

## 2024-01-20 DIAGNOSIS — T8149XA Infection following a procedure, other surgical site, initial encounter: Secondary | ICD-10-CM | POA: Diagnosis present

## 2024-01-20 DIAGNOSIS — Z79899 Other long term (current) drug therapy: Secondary | ICD-10-CM | POA: Diagnosis not present

## 2024-01-20 DIAGNOSIS — B965 Pseudomonas (aeruginosa) (mallei) (pseudomallei) as the cause of diseases classified elsewhere: Secondary | ICD-10-CM | POA: Diagnosis present

## 2024-01-20 DIAGNOSIS — Y838 Other surgical procedures as the cause of abnormal reaction of the patient, or of later complication, without mention of misadventure at the time of the procedure: Secondary | ICD-10-CM | POA: Diagnosis present

## 2024-01-20 DIAGNOSIS — M1990 Unspecified osteoarthritis, unspecified site: Secondary | ICD-10-CM | POA: Diagnosis present

## 2024-01-20 DIAGNOSIS — T8141XA Infection following a procedure, superficial incisional surgical site, initial encounter: Principal | ICD-10-CM | POA: Diagnosis present

## 2024-01-20 DIAGNOSIS — Z981 Arthrodesis status: Secondary | ICD-10-CM | POA: Diagnosis not present

## 2024-01-20 DIAGNOSIS — R531 Weakness: Principal | ICD-10-CM | POA: Diagnosis present

## 2024-01-20 DIAGNOSIS — Z87891 Personal history of nicotine dependence: Secondary | ICD-10-CM

## 2024-01-20 DIAGNOSIS — Z887 Allergy status to serum and vaccine status: Secondary | ICD-10-CM

## 2024-01-20 DIAGNOSIS — B958 Unspecified staphylococcus as the cause of diseases classified elsewhere: Secondary | ICD-10-CM | POA: Diagnosis not present

## 2024-01-20 DIAGNOSIS — Z7982 Long term (current) use of aspirin: Secondary | ICD-10-CM | POA: Diagnosis not present

## 2024-01-20 DIAGNOSIS — I38 Endocarditis, valve unspecified: Secondary | ICD-10-CM | POA: Diagnosis not present

## 2024-01-20 DIAGNOSIS — R262 Difficulty in walking, not elsewhere classified: Secondary | ICD-10-CM | POA: Diagnosis present

## 2024-01-20 DIAGNOSIS — T8149XD Infection following a procedure, other surgical site, subsequent encounter: Secondary | ICD-10-CM | POA: Diagnosis not present

## 2024-01-20 HISTORY — PX: LUMBAR WOUND DEBRIDEMENT: SHX1988

## 2024-01-20 LAB — BASIC METABOLIC PANEL WITH GFR
Anion gap: 16 — ABNORMAL HIGH (ref 5–15)
BUN: 16 mg/dL (ref 8–23)
CO2: 22 mmol/L (ref 22–32)
Calcium: 9.4 mg/dL (ref 8.9–10.3)
Chloride: 95 mmol/L — ABNORMAL LOW (ref 98–111)
Creatinine, Ser: 0.89 mg/dL (ref 0.61–1.24)
GFR, Estimated: >60 mL/min (ref >60)
Glucose, Bld: 111 mg/dL — ABNORMAL HIGH (ref 70–99)
Potassium: 3.5 mmol/L (ref 3.5–5.1)
Sodium: 133 mmol/L — ABNORMAL LOW (ref 135–145)

## 2024-01-20 LAB — CBC
HCT: 38.5 % — ABNORMAL LOW (ref 39.0–52.0)
Hemoglobin: 12.8 g/dL — ABNORMAL LOW (ref 13.0–17.0)
MCH: 33.5 pg (ref 26.0–34.0)
MCHC: 33.2 g/dL (ref 30.0–36.0)
MCV: 100.8 fL — ABNORMAL HIGH (ref 80.0–100.0)
Platelets: 288 K/uL (ref 150–400)
RBC: 3.82 MIL/uL — ABNORMAL LOW (ref 4.22–5.81)
RDW: 14.4 % (ref 11.5–15.5)
WBC: 13.3 K/uL — ABNORMAL HIGH (ref 4.0–10.5)
nRBC: 0 % (ref 0.0–0.2)

## 2024-01-20 LAB — URINALYSIS, ROUTINE W REFLEX MICROSCOPIC
Bacteria, UA: NONE SEEN
Bilirubin Urine: NEGATIVE
Glucose, UA: NEGATIVE mg/dL
Hgb urine dipstick: NEGATIVE
Ketones, ur: 20 mg/dL — AB
Leukocytes,Ua: NEGATIVE
Nitrite: NEGATIVE
Protein, ur: 30 mg/dL — AB
Specific Gravity, Urine: 1.014 (ref 1.005–1.030)
pH: 6 (ref 5.0–8.0)

## 2024-01-20 LAB — I-STAT CG4 LACTIC ACID, ED
Lactic Acid, Venous: 1.9 mmol/L (ref 0.5–1.9)
Lactic Acid, Venous: 3 mmol/L (ref 0.5–1.9)

## 2024-01-20 SURGERY — LUMBAR WOUND DEBRIDEMENT
Anesthesia: General | Site: Back

## 2024-01-20 MED ORDER — HYDROXYCHLOROQUINE SULFATE 200 MG PO TABS
200.0000 mg | ORAL_TABLET | Freq: Two times a day (BID) | ORAL | Status: DC
Start: 2024-01-20 — End: 2024-01-26
  Administered 2024-01-21 – 2024-01-26 (×12): 200 mg via ORAL
  Filled 2024-01-20 (×12): qty 1

## 2024-01-20 MED ORDER — CHLORHEXIDINE GLUCONATE 4 % EX SOLN: 1.0000 | CUTANEOUS | 1 refills | Status: DC

## 2024-01-20 MED ORDER — OXYCODONE HCL 5 MG PO TABS
10.0000 mg | ORAL_TABLET | ORAL | Status: DC | PRN
  Administered 2024-01-21 – 2024-01-24 (×10): 10 mg via ORAL
  Filled 2024-01-20 (×11): qty 2

## 2024-01-20 MED ORDER — SENNA 8.6 MG PO TABS
1.0000 | ORAL_TABLET | Freq: Two times a day (BID) | ORAL | Status: DC
  Administered 2024-01-21 – 2024-01-26 (×11): 8.6 mg via ORAL
  Filled 2024-01-20 (×11): qty 1

## 2024-01-20 MED ORDER — LACTATED RINGERS IV BOLUS
500.0000 mL | Freq: Once | INTRAVENOUS | Status: AC
  Administered 2024-01-20: 500 mL via INTRAVENOUS

## 2024-01-20 MED ORDER — IRBESARTAN 300 MG PO TABS
300.0000 mg | ORAL_TABLET | Freq: Every day | ORAL | Status: DC
  Administered 2024-01-21 – 2024-01-26 (×6): 300 mg via ORAL
  Filled 2024-01-20 (×6): qty 1

## 2024-01-20 MED ORDER — PHENYLEPHRINE HCL-NACL 20-0.9 MG/250ML-% IV SOLN
INTRAVENOUS | Status: DC | PRN
  Administered 2024-01-20: 25 ug/min via INTRAVENOUS

## 2024-01-20 MED ORDER — TAMSULOSIN HCL 0.4 MG PO CAPS
0.4000 mg | ORAL_CAPSULE | Freq: Every day | ORAL | Status: DC
  Administered 2024-01-21 – 2024-01-26 (×6): 0.4 mg via ORAL
  Filled 2024-01-20 (×6): qty 1

## 2024-01-20 MED ORDER — LORATADINE 10 MG PO TABS
10.0000 mg | ORAL_TABLET | Freq: Every day | ORAL | Status: DC
  Administered 2024-01-21 – 2024-01-26 (×6): 10 mg via ORAL
  Filled 2024-01-20 (×6): qty 1

## 2024-01-20 MED ORDER — THROMBIN 20000 UNITS EX SOLR: CUTANEOUS | Status: DC | PRN

## 2024-01-20 MED ORDER — SODIUM CHLORIDE 0.9% FLUSH: 3.0000 mL | INTRAVENOUS | Status: DC | PRN

## 2024-01-20 MED ORDER — ACETAMINOPHEN 500 MG PO TABS
1000.0000 mg | ORAL_TABLET | Freq: Four times a day (QID) | ORAL | Status: AC
  Administered 2024-01-21 – 2024-01-22 (×4): 1000 mg via ORAL
  Filled 2024-01-20 (×3): qty 2

## 2024-01-20 MED ORDER — POTASSIUM CHLORIDE IN NACL 20-0.9 MEQ/L-% IV SOLN
INTRAVENOUS | Status: DC
Start: 2024-01-21 — End: 2024-01-26
  Filled 2024-01-20: qty 1000

## 2024-01-20 MED ORDER — ASPIRIN 81 MG PO TBEC
81.0000 mg | DELAYED_RELEASE_TABLET | Freq: Every day | ORAL | Status: DC
  Administered 2024-01-21 – 2024-01-26 (×6): 81 mg via ORAL
  Filled 2024-01-20 (×6): qty 1

## 2024-01-20 MED ORDER — 0.9 % SODIUM CHLORIDE (POUR BTL) OPTIME
TOPICAL | Status: DC | PRN
Start: 2024-01-20 — End: 2024-01-20
  Administered 2024-01-20 (×3): 1000 mL

## 2024-01-20 MED ORDER — ACETAMINOPHEN 325 MG PO TABS
650.0000 mg | ORAL_TABLET | ORAL | Status: DC | PRN
  Administered 2024-01-21 – 2024-01-23 (×2): 650 mg via ORAL
  Filled 2024-01-20 (×2): qty 2

## 2024-01-20 MED ORDER — PANTOPRAZOLE SODIUM 40 MG PO TBEC
80.0000 mg | DELAYED_RELEASE_TABLET | Freq: Every day | ORAL | Status: DC
  Administered 2024-01-21 – 2024-01-26 (×6): 80 mg via ORAL
  Filled 2024-01-20 (×6): qty 2

## 2024-01-20 MED ORDER — ACETAMINOPHEN 650 MG RE SUPP: 650.0000 mg | RECTAL | Status: DC | PRN

## 2024-01-20 MED ORDER — SULFASALAZINE 500 MG PO TABS
1000.0000 mg | ORAL_TABLET | Freq: Two times a day (BID) | ORAL | Status: DC
  Administered 2024-01-21 – 2024-01-26 (×12): 1000 mg via ORAL
  Filled 2024-01-20 (×13): qty 2

## 2024-01-20 MED ORDER — THROMBIN 20000 UNITS EX SOLR
CUTANEOUS | Status: AC
  Filled 2024-01-20: qty 20000

## 2024-01-20 MED ORDER — AMLODIPINE BESYLATE 5 MG PO TABS
5.0000 mg | ORAL_TABLET | Freq: Every day | ORAL | Status: DC
  Administered 2024-01-21 – 2024-01-26 (×6): 5 mg via ORAL
  Filled 2024-01-20 (×6): qty 1

## 2024-01-20 MED ORDER — LACTATED RINGERS IV SOLN: INTRAVENOUS | Status: DC | PRN

## 2024-01-20 MED ORDER — SODIUM CHLORIDE 0.9% FLUSH
3.0000 mL | Freq: Two times a day (BID) | INTRAVENOUS | Status: DC
  Administered 2024-01-21 – 2024-01-26 (×7): 3 mL via INTRAVENOUS

## 2024-01-20 MED ORDER — FOLIC ACID 1 MG PO TABS
1.0000 mg | ORAL_TABLET | Freq: Every day | ORAL | Status: DC
  Administered 2024-01-21 – 2024-01-26 (×6): 1 mg via ORAL
  Filled 2024-01-20 (×6): qty 1

## 2024-01-20 MED ORDER — SUGAMMADEX SODIUM 200 MG/2ML IV SOLN
INTRAVENOUS | Status: DC | PRN
  Administered 2024-01-20: 280 mg via INTRAVENOUS

## 2024-01-20 MED ORDER — MORPHINE SULFATE (PF) 2 MG/ML IV SOLN: 1.0000 mg | INTRAVENOUS | Status: DC | PRN

## 2024-01-20 MED ORDER — FENTANYL CITRATE (PF) 100 MCG/2ML IJ SOLN
25.0000 ug | INTRAMUSCULAR | Status: DC | PRN
  Administered 2024-01-20: 25 ug via INTRAVENOUS

## 2024-01-20 MED ORDER — DIAZEPAM 2 MG PO TABS
2.0000 mg | ORAL_TABLET | Freq: Four times a day (QID) | ORAL | Status: DC | PRN
  Administered 2024-01-21 – 2024-01-25 (×2): 2 mg via ORAL
  Filled 2024-01-20 (×2): qty 1

## 2024-01-20 MED ORDER — ONDANSETRON HCL 4 MG/2ML IJ SOLN: 4.0000 mg | Freq: Once | INTRAMUSCULAR | Status: DC | PRN

## 2024-01-20 MED ORDER — LIDOCAINE 2% (20 MG/ML) 5 ML SYRINGE
INTRAMUSCULAR | Status: DC | PRN
  Administered 2024-01-20: 80 mg via INTRAVENOUS

## 2024-01-20 MED ORDER — PHENOL 1.4 % MT LIQD
1.0000 | OROMUCOSAL | Status: DC | PRN
Start: 2024-01-20 — End: 2024-01-26
  Filled 2024-01-20: qty 177

## 2024-01-20 MED ORDER — VITAMIN D 25 MCG (1000 UNIT) PO TABS
1000.0000 [IU] | ORAL_TABLET | Freq: Two times a day (BID) | ORAL | Status: DC
  Administered 2024-01-21 – 2024-01-26 (×12): 1000 [IU] via ORAL
  Filled 2024-01-20 (×12): qty 1

## 2024-01-20 MED ORDER — VANCOMYCIN HCL IN DEXTROSE 1-5 GM/200ML-% IV SOLN
1000.0000 mg | Freq: Once | INTRAVENOUS | Status: AC
  Administered 2024-01-21: 1000 mg via INTRAVENOUS
  Filled 2024-01-20: qty 200

## 2024-01-20 MED ORDER — ALUM & MAG HYDROXIDE-SIMETH 200-200-20 MG/5ML PO SUSP: 30.0000 mL | Freq: Four times a day (QID) | ORAL | Status: DC | PRN

## 2024-01-20 MED ORDER — ADULT MULTIVITAMIN W/MINERALS CH
1.0000 | ORAL_TABLET | Freq: Every day | ORAL | Status: DC
  Administered 2024-01-21 – 2024-01-26 (×6): 1 via ORAL
  Filled 2024-01-20 (×6): qty 1

## 2024-01-20 MED ORDER — ONDANSETRON HCL 4 MG/2ML IJ SOLN
INTRAMUSCULAR | Status: DC | PRN
  Administered 2024-01-20: 4 mg via INTRAVENOUS

## 2024-01-20 MED ORDER — HYDROCHLOROTHIAZIDE 12.5 MG PO TABS
12.5000 mg | ORAL_TABLET | Freq: Every day | ORAL | Status: DC
  Administered 2024-01-21 – 2024-01-26 (×6): 12.5 mg via ORAL
  Filled 2024-01-20 (×6): qty 1

## 2024-01-20 MED ORDER — FENTANYL CITRATE (PF) 250 MCG/5ML IJ SOLN
INTRAMUSCULAR | Status: DC | PRN
  Administered 2024-01-20: 50 ug via INTRAVENOUS

## 2024-01-20 MED ORDER — SENNOSIDES-DOCUSATE SODIUM 8.6-50 MG PO TABS
1.0000 | ORAL_TABLET | Freq: Every evening | ORAL | Status: DC | PRN
  Administered 2024-01-24: 1 via ORAL
  Filled 2024-01-20: qty 1

## 2024-01-20 MED ORDER — OLMESARTAN-AMLODIPINE-HCTZ 40-5-12.5 MG PO TABS: 1.0000 | ORAL_TABLET | Freq: Every morning | ORAL | Status: DC

## 2024-01-20 MED ORDER — SODIUM CHLORIDE 0.9 % IV SOLN: 250.0000 mL | INTRAVENOUS | Status: AC

## 2024-01-20 MED ORDER — OXYCODONE HCL 5 MG PO TABS
5.0000 mg | ORAL_TABLET | ORAL | Status: DC | PRN
  Administered 2024-01-25: 5 mg via ORAL
  Filled 2024-01-20 (×2): qty 1

## 2024-01-20 MED ORDER — FENTANYL CITRATE (PF) 100 MCG/2ML IJ SOLN
INTRAMUSCULAR | Status: AC
  Filled 2024-01-20: qty 2

## 2024-01-20 MED ORDER — POLYETHYLENE GLYCOL 3350 17 G PO PACK
17.0000 g | PACK | Freq: Every day | ORAL | Status: DC
  Administered 2024-01-21 – 2024-01-26 (×6): 17 g via ORAL
  Filled 2024-01-20 (×6): qty 1

## 2024-01-20 MED ORDER — ZOLPIDEM TARTRATE 5 MG PO TABS: 5.0000 mg | ORAL_TABLET | Freq: Every evening | ORAL | Status: DC | PRN

## 2024-01-20 MED ORDER — ACETAMINOPHEN 10 MG/ML IV SOLN
INTRAVENOUS | Status: DC | PRN
Start: 2024-01-20 — End: 2024-01-20
  Administered 2024-01-20: 1000 mg via INTRAVENOUS

## 2024-01-20 MED ORDER — VANCOMYCIN HCL 1000 MG IV SOLR
INTRAVENOUS | Status: AC
Start: 2024-01-20 — End: 2024-01-20
  Filled 2024-01-20: qty 20

## 2024-01-20 MED ORDER — ACETAMINOPHEN 10 MG/ML IV SOLN
INTRAVENOUS | Status: AC
  Filled 2024-01-20: qty 100

## 2024-01-20 MED ORDER — PROPOFOL 10 MG/ML IV BOLUS
INTRAVENOUS | Status: DC | PRN
  Administered 2024-01-20: 10 mg via INTRAVENOUS
  Administered 2024-01-20: 70 mg via INTRAVENOUS

## 2024-01-20 MED ORDER — ROCURONIUM BROMIDE 10 MG/ML (PF) SYRINGE
PREFILLED_SYRINGE | INTRAVENOUS | Status: DC | PRN
  Administered 2024-01-20: 50 mg via INTRAVENOUS

## 2024-01-20 MED ORDER — MENTHOL 3 MG MT LOZG: 1.0000 | LOZENGE | OROMUCOSAL | Status: DC | PRN

## 2024-01-20 MED ORDER — MUPIROCIN 2 % EX OINT: 1.0000 | TOPICAL_OINTMENT | Freq: Two times a day (BID) | CUTANEOUS | 0 refills | Status: DC

## 2024-01-20 MED ORDER — DEXAMETHASONE SOD PHOSPHATE PF 10 MG/ML IJ SOLN
INTRAMUSCULAR | Status: DC | PRN
  Administered 2024-01-20: 4 mg via INTRAVENOUS

## 2024-01-20 MED ORDER — PHENYLEPHRINE 80 MCG/ML (10ML) SYRINGE FOR IV PUSH (FOR BLOOD PRESSURE SUPPORT)
PREFILLED_SYRINGE | INTRAVENOUS | Status: DC | PRN
Start: 2024-01-20 — End: 2024-01-20
  Administered 2024-01-20: 160 ug via INTRAVENOUS
  Administered 2024-01-20: 80 ug via INTRAVENOUS

## 2024-01-20 MED ORDER — ONDANSETRON HCL 4 MG PO TABS: 4.0000 mg | ORAL_TABLET | Freq: Four times a day (QID) | ORAL | Status: DC | PRN

## 2024-01-20 MED ORDER — FENTANYL CITRATE (PF) 250 MCG/5ML IJ SOLN
INTRAMUSCULAR | Status: AC
  Filled 2024-01-20: qty 5

## 2024-01-20 MED ORDER — NIACIN 500 MG PO TABS
500.0000 mg | ORAL_TABLET | Freq: Two times a day (BID) | ORAL | Status: DC
  Administered 2024-01-21 – 2024-01-25 (×10): 500 mg via ORAL
  Filled 2024-01-20 (×15): qty 1

## 2024-01-20 MED ORDER — BISACODYL 5 MG PO TBEC: 5.0000 mg | DELAYED_RELEASE_TABLET | Freq: Every day | ORAL | Status: DC | PRN

## 2024-01-20 MED ORDER — CALCIUM CARBONATE 1250 (500 CA) MG PO TABS
1250.0000 mg | ORAL_TABLET | Freq: Two times a day (BID) | ORAL | Status: DC
  Administered 2024-01-21 – 2024-01-26 (×11): 1250 mg via ORAL
  Filled 2024-01-20 (×12): qty 1

## 2024-01-20 MED ORDER — HEPARIN SODIUM (PORCINE) 5000 UNIT/ML IJ SOLN
5000.0000 [IU] | Freq: Three times a day (TID) | INTRAMUSCULAR | Status: DC
  Administered 2024-01-21 – 2024-01-23 (×6): 5000 [IU] via SUBCUTANEOUS
  Filled 2024-01-20 (×6): qty 1

## 2024-01-20 MED ORDER — VANCOMYCIN HCL 1000 MG IV SOLR
INTRAVENOUS | Status: DC | PRN
  Administered 2024-01-20: 1000 mg via INTRAVENOUS

## 2024-01-20 MED ORDER — MAGNESIUM CITRATE PO SOLN: 1.0000 | Freq: Once | ORAL | Status: DC | PRN

## 2024-01-20 MED ORDER — HYPROMELLOSE (GONIOSCOPIC) 2.5 % OP SOLN: 1.0000 [drp] | Freq: Every day | OPHTHALMIC | Status: DC | PRN

## 2024-01-20 MED ORDER — ONDANSETRON HCL 4 MG/2ML IJ SOLN: 4.0000 mg | Freq: Four times a day (QID) | INTRAMUSCULAR | Status: DC | PRN

## 2024-01-20 SURGICAL SUPPLY — 49 items
BAG COUNTER SPONGE SURGICOUNT (BAG) ×1 IMPLANT
BENZOIN TINCTURE PRP APPL 2/3 (GAUZE/BANDAGES/DRESSINGS) IMPLANT
BLADE CLIPPER SURG (BLADE) IMPLANT
CANISTER SUCTION 3000ML PPV (SUCTIONS) ×1 IMPLANT
CANISTER WOUND CARE 500ML ATS (WOUND CARE) IMPLANT
DRAPE LAPAROTOMY 100X72X124 (DRAPES) ×1 IMPLANT
DRAPE SURG 17X23 STRL (DRAPES) ×1 IMPLANT
DRSG VAC GRANUFOAM MED (GAUZE/BANDAGES/DRESSINGS) IMPLANT
DURAPREP 26ML APPLICATOR (WOUND CARE) ×1 IMPLANT
ELECTRODE REM PT RTRN 9FT ADLT (ELECTROSURGICAL) ×1 IMPLANT
GAUZE 4X4 16PLY ~~LOC~~+RFID DBL (SPONGE) IMPLANT
GAUZE SPONGE 4X4 12PLY STRL (GAUZE/BANDAGES/DRESSINGS) IMPLANT
GLOVE BIO SURGEON STRL SZ 6.5 (GLOVE) IMPLANT
GLOVE BIO SURGEON STRL SZ7 (GLOVE) IMPLANT
GLOVE BIO SURGEON STRL SZ7.5 (GLOVE) IMPLANT
GLOVE BIO SURGEON STRL SZ8 (GLOVE) IMPLANT
GLOVE BIO SURGEON STRL SZ8.5 (GLOVE) IMPLANT
GLOVE BIOGEL M 8.0 STRL (GLOVE) IMPLANT
GLOVE ECLIPSE 6.5 STRL STRAW (GLOVE) ×1 IMPLANT
GLOVE ECLIPSE 7.0 STRL STRAW (GLOVE) IMPLANT
GLOVE ECLIPSE 7.5 STRL STRAW (GLOVE) IMPLANT
GLOVE ECLIPSE 8.0 STRL XLNG CF (GLOVE) IMPLANT
GLOVE ECLIPSE 8.5 STRL (GLOVE) IMPLANT
GLOVE INDICATOR 6.5 STRL GRN (GLOVE) IMPLANT
GLOVE INDICATOR 7.0 STRL GRN (GLOVE) IMPLANT
GLOVE INDICATOR 7.5 STRL GRN (GLOVE) IMPLANT
GLOVE INDICATOR 8.0 STRL GRN (GLOVE) IMPLANT
GLOVE INDICATOR 8.5 STRL (GLOVE) IMPLANT
GLOVE SURG SS PI 6.5 STRL IVOR (GLOVE) IMPLANT
GOWN STRL REUS W/ TWL LRG LVL3 (GOWN DISPOSABLE) ×2 IMPLANT
GOWN STRL REUS W/ TWL XL LVL3 (GOWN DISPOSABLE) IMPLANT
GOWN STRL REUS W/TWL 2XL LVL3 (GOWN DISPOSABLE) IMPLANT
KIT BASIN OR (CUSTOM PROCEDURE TRAY) ×1 IMPLANT
KIT TURNOVER KIT B (KITS) ×1 IMPLANT
PACK LAMINECTOMY NEURO (CUSTOM PROCEDURE TRAY) ×1 IMPLANT
PAD ARMBOARD POSITIONER FOAM (MISCELLANEOUS) ×3 IMPLANT
SOLN 0.9% NACL POUR BTL 1000ML (IV SOLUTION) ×1 IMPLANT
SOLN STERILE WATER BTL 1000 ML (IV SOLUTION) ×1 IMPLANT
SPIKE FLUID TRANSFER (MISCELLANEOUS) ×1 IMPLANT
SPONGE SURGIFOAM ABS GEL SZ50 (HEMOSTASIS) ×1 IMPLANT
SPONGE T-LAP 4X18 ~~LOC~~+RFID (SPONGE) IMPLANT
STRIP CLOSURE SKIN 1/2X4 (GAUZE/BANDAGES/DRESSINGS) IMPLANT
SUT VIC AB 0 CT1 18XCR BRD8 (SUTURE) ×1 IMPLANT
SUT VIC AB 2-0 CT1 18 (SUTURE) ×1 IMPLANT
SUT VIC AB 3-0 SH 8-18 (SUTURE) ×1 IMPLANT
SWAB COLLECTION DEVICE MRSA (MISCELLANEOUS) IMPLANT
SWAB CULTURE ESWAB REG 1ML (MISCELLANEOUS) IMPLANT
TOWEL GREEN STERILE (TOWEL DISPOSABLE) ×1 IMPLANT
TOWEL GREEN STERILE FF (TOWEL DISPOSABLE) ×1 IMPLANT

## 2024-01-20 NOTE — ED Notes (Signed)
 Report given to PACU.

## 2024-01-20 NOTE — Anesthesia Procedure Notes (Signed)
 Procedure Name: Intubation Date/Time: 01/20/2024 7:51 PM  Performed by: Jama Powell NOVAK, CRNAPre-anesthesia Checklist: Patient identified, Timeout performed, Emergency Drugs available, Suction available and Patient being monitored Patient Re-evaluated:Patient Re-evaluated prior to induction Oxygen Delivery Method: Circle system utilized Preoxygenation: Pre-oxygenation with 100% oxygen Induction Type: IV induction Ventilation: Mask ventilation without difficulty Laryngoscope Size: Mac and 4 Grade View: Grade II Tube size: 7.5 mm Number of attempts: 1 Airway Equipment and Method: Stylet Placement Confirmation: ETT inserted through vocal cords under direct vision, positive ETCO2, CO2 detector and breath sounds checked- equal and bilateral Secured at: 23 cm Tube secured with: Tape Dental Injury: Teeth and Oropharynx as per pre-operative assessment

## 2024-01-20 NOTE — Anesthesia Preprocedure Evaluation (Addendum)
 Anesthesia Evaluation  Patient identified by MRN, date of birth, ID band Patient awake    Reviewed: Allergy & Precautions, H&P , NPO status , Patient's Chart, lab work & pertinent test results  History of Anesthesia Complications Negative for: history of anesthetic complications  Airway Mallampati: II  TM Distance: >3 FB Neck ROM: Full    Dental  (+) Edentulous Upper, Missing   Pulmonary neg COPD, former smoker   Pulmonary exam normal breath sounds clear to auscultation       Cardiovascular hypertension, + CAD  + dysrhythmias Atrial Fibrillation + Valvular Problems/Murmurs AS  Rhythm:Irregular Rate:Normal + Systolic murmurs TTE read with mild AS but AVA 0.9 and DI 0.26. 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    IMPRESSIONS     1. Left ventricular ejection fraction, by estimation, is 60 to 65%. The  left ventricle has normal function. The left ventricle has no regional  wall motion abnormalities. Left ventricular diastolic parameters are  consistent with Grade I diastolic  dysfunction (impaired relaxation). The average left ventricular global  longitudinal strain is -26.9 %. The global longitudinal strain is normal.   2. Right ventricular systolic function is normal. The right ventricular  size is normal. There is normal pulmonary artery systolic pressure.   3. The mitral valve is normal in structure. No evidence of mitral valve  regurgitation. No evidence of mitral stenosis.   4. The aortic valve was not well visualized. Aortic valve regurgitation  is not visualized. Mild aortic valve stenosis.   5. The inferior vena cava is normal in size with greater than 50%  respiratory variability, suggesting right atrial pressure of 3 mmHg.      Neuro/Psych neg Headaches, neg Seizures S/P lumbar laminectomy with wound infection  Neuromuscular disease  negative psych ROS   GI/Hepatic Neg liver ROS,GERD  ,,  Endo/Other   negative endocrine ROS    Renal/GU negative Renal ROS  negative genitourinary   Musculoskeletal  (+) Arthritis ,    Abdominal   Peds negative pediatric ROS (+)  Hematology negative hematology ROS (+) Blood dyscrasia, anemia   Anesthesia Other Findings Day of surgery medications reviewed with the patient.  Reproductive/Obstetrics negative OB ROS                              Anesthesia Physical Anesthesia Plan  ASA: 4  Anesthesia Plan: General   Post-op Pain Management: Ofirmev  IV (intra-op)*   Induction: Intravenous  PONV Risk Score and Plan: 2 and Ondansetron , Dexamethasone  and Treatment may vary due to age or medical condition  Airway Management Planned: Oral ETT  Additional Equipment: ClearSight  Intra-op Plan:   Post-operative Plan: Extubation in OR  Informed Consent: I have reviewed the patients History and Physical, chart, labs and discussed the procedure including the risks, benefits and alternatives for the proposed anesthesia with the patient or authorized representative who has indicated his/her understanding and acceptance.     Dental advisory given  Plan Discussed with: CRNA  Anesthesia Plan Comments: (PAT note by Lynwood Hope, PA-C (from initial surgery):  83 year old male with pertinent history including HTN, GERD on PPI, rheumatoid arthritis.  Patient initially presented for the surgery on 08/23/2023 but was noted by Dr. Darlyn to have systolic murmur that had not previously been evaluated.  And recommended procedure be delayed to allow for cardiology evaluation.  Patient subsequently seen by Dr. Bernie on 09/19/2023.  Echo and coronary  CTA were ordered.  Echo 10/05/2023 showed LVEF 60 to 65%, grade 1 DD, normal RV, mild aortic stenosis with mean gradient 12.8 mmHg and AVA 0.91 cm.  Coronary CTA with FFR analysis 10/16/2023 showed high likelihood of hemodynamically significance of mid OM1.  Patient subsequently had  catheterization on 10/26/2023 showing borderline significant one-vessel coronary artery disease involving OM 2.  Medical therapy was recommended.  Per recommendations, Recommend medical therapy for coronary artery disease and monitoring aortic stenosis for now. OM2 stenosis is approachable by PCI.  However, no indication now given that the patient has minimal symptoms related to this.  In addition, this will not change his preop cardiovascular risk and will only delay surgery given the need for antiplatelet medications.  He should be considered at relatively low risk for cardiovascular complications.  Reviewed with anesthesiologist Dr. CHARLENA Needle, okay to proceed as planned barring acute status change. Advised will likely need a-line.   Preop labs reviewed, mild thrombocytopenia platelets 145, otherwise unremarkable.  EKG 10/24/2023: Sinus rhythm with Premature atrial complexes.  Rate 74. Incomplete right bundle branch block. Minimal voltage criteria for LVH, may be normal variant ( R in aVL ). Inferior infarct , age undetermined. Cannot rule out Anterior infarct , age undetermined  Cath 10/26/2023:   Mid LAD lesion is 30% stenosed.   2nd Mrg lesion is 70% stenosed.   Ost RCA lesion is 40% stenosed.  1.  Borderline significant one-vessel coronary artery disease involving OM 2. 2.  Left ventricular angiography was not performed.  EF was normal by echo. 3.  Right heart catheterization showed normal filling pressures, normal pulmonary pressure and high cardiac output. 4.  Mild aortic stenosis with mean gradient of 8 mmHg and valve area greater than 2 cm.  Recommendations: Recommend medical therapy for coronary artery disease and monitoring aortic stenosis for now. OM2 stenosis is approachable by PCI.  However, no indication now given that the patient has minimal symptoms related to this.  In addition, this will not change his preop cardiovascular risk and will only delay surgery given the need  for antiplatelet medications.  He should be considered at relatively low risk for cardiovascular complications.  TTE 10/05/2023: 1. Left ventricular ejection fraction, by estimation, is 60 to 65%. The  left ventricle has normal function. The left ventricle has no regional  wall motion abnormalities. Left ventricular diastolic parameters are  consistent with Grade I diastolic  dysfunction (impaired relaxation). The average left ventricular global  longitudinal strain is -26.9 %. The global longitudinal strain is normal.  2. Right ventricular systolic function is normal. The right ventricular  size is normal. There is normal pulmonary artery systolic pressure.  3. The mitral valve is normal in structure. No evidence of mitral valve  regurgitation. No evidence of mitral stenosis.  4. The aortic valve was not well visualized. Aortic valve regurgitation  is not visualized. Mild aortic valve stenosis.  5. The inferior vena cava is normal in size with greater than 50%  respiratory variability, suggesting right atrial pressure of 3 mmHg.   )         Anesthesia Quick Evaluation

## 2024-01-20 NOTE — Anesthesia Postprocedure Evaluation (Signed)
 Anesthesia Post Note  Patient: Frank Zuniga  Procedure(s) Performed: LUMBAR WOUND DEBRIDEMENT (Back)     Patient location during evaluation: PACU Anesthesia Type: General Level of consciousness: awake and alert Pain management: pain level controlled Vital Signs Assessment: post-procedure vital signs reviewed and stable Respiratory status: spontaneous breathing, nonlabored ventilation and respiratory function stable Cardiovascular status: blood pressure returned to baseline and stable Postop Assessment: no apparent nausea or vomiting Anesthetic complications: no   No notable events documented.  Last Vitals:  Vitals:   01/20/24 2055 01/20/24 2105  BP: 128/74 131/66  Pulse: (!) 106 98  Resp: 16 20  Temp:    SpO2: 96% 95%    Last Pain:  Vitals:   01/20/24 2104  TempSrc:   PainSc: 6                  Garnette FORBES Skillern

## 2024-01-20 NOTE — ED Triage Notes (Signed)
 Pt BIB [facility] from home for weakness progressing over last day or two. Pt had back procedure 10/20, incision with minimal drainage now. Pt fell yesterday and has had difficulty ambulating, has apt with back surgeon Monday but could not wait any longer. Per pt no fevers and no abx. EMS vitals: 132/82 HR 90 96% RA

## 2024-01-20 NOTE — Op Note (Signed)
 01/20/2024  8:42 PM  PATIENT:  Frank Zuniga  83 y.o. male With an apparent wound infection. He was taken to the operating room for a wound exploration.  PRE-OPERATIVE DIAGNOSIS:  infected lumbar wound  POST-OPERATIVE DIAGNOSIS:  infected lumbar wound  PROCEDURE:  Procedure(s): LUMBAR WOUND DEBRIDEMENT  SURGEON: Surgeon(s): Gillie Duncans, MD  ASSISTANTS:none  ANESTHESIA:   general  EBL:  Total I/O In: 350 [IV Piggyback:350] Out: -   BLOOD ADMINISTERED:none  CELL SAVER GIVEN:not used  COUNT:per nursing  DRAINS: wound vac   SPECIMEN:  Source of Specimen:  lumbar wound  DICTATION: Frank Zuniga was taken to the operating room, intubated, and placed under a general anesthetic without difficulty. He was positioned prone on a Wilson frame with all pressure points padded. His incision was prepped and draped in a sterile manner.  I opened the incision with scissors and had immediate purulent effluent from the wound. I took cultures of the purulence. I continued the exposure opening the thoracolumbar fascia. I inspected the pedicle screws on both sides and they had good purchase in the bone. I irrigated 3 liters of saline into the wound. I placed sponges then the wound vac. He was rolled supine on the OR stretcher, extubated, and was brought to the PACE moving all extremities.  PLAN OF CARE: Admit to inpatient   PATIENT DISPOSITION:  PACU - hemodynamically stable.   Delay start of Pharmacological VTE agent (>24hrs) due to surgical blood loss or risk of bleeding:  no

## 2024-01-20 NOTE — Transfer of Care (Signed)
 Immediate Anesthesia Transfer of Care Note  Patient: Frank Zuniga  Procedure(s) Performed: LUMBAR WOUND DEBRIDEMENT (Back)  Patient Location: PACU  Anesthesia Type:General  Level of Consciousness: awake and alert   Airway & Oxygen Therapy: Patient Spontanous Breathing and Patient connected to nasal cannula oxygen  Post-op Assessment: Report given to RN and Post -op Vital signs reviewed and stable  Post vital signs: Reviewed and stable  Last Vitals:  Vitals Value Taken Time  BP 116/64 01/20/24 20:40  Temp    Pulse 107 01/20/24 20:42  Resp 24 01/20/24 20:42  SpO2 92 % 01/20/24 20:42  Vitals shown include unfiled device data.  Last Pain:  Vitals:   01/20/24 1547  TempSrc: Oral         Complications: No notable events documented.

## 2024-01-20 NOTE — ED Provider Notes (Signed)
 [place] EMERGENCY DEPARTMENT AT [facility] Provider Note   CSN: [id number] Arrival date & time: 01/20/24  1538     Patient presents with: Weakness   Frank Zuniga is a 83 y.o. male.    Weakness    Pt had back surgery on the 20th. Pt states he has been having trouble with pain in his back and weakness in his legs making it hard to stand.  He feels like it has been like this since leaving the hospital.  However he feels like it got worse over the last week.  No fevers.  No vomiting or diarrhea.  He called his surgeon and has an appointment on Monday.  Pt is taking pain medications, whatever he was prescribed, he thinks oxycodone.  It helps some but the pain was worse today.  Prior to Admission medications   Medication Sig Start Date End Date Taking? Authorizing Provider  acetaminophen  (TYLENOL ) 500 MG tablet Take 500-1,000 mg by mouth every 6 (six) hours as needed (pain.).    [provider]  aspirin  81 MG EC tablet Take 81 mg by mouth daily. 02/08/20   [provider]  calcium carbonate (SUPER CALCIUM) 1500 (600 Ca) MG TABS tablet Take 600 mg of elemental calcium by mouth 2 (two) times daily with a meal. 02/08/20   [provider]  cetirizine (ZYRTEC) 10 MG tablet Take 10 mg by mouth daily.    [provider]  Cholecalciferol 25 MCG (1000 UT) tablet Take 1,000 Units by mouth 2 (two) times daily. 02/08/20   [provider]  cyclobenzaprine (FLEXERIL) 5 MG tablet Take 1 tablet (5 mg total) by mouth 3 (three) times daily as needed for muscle spasms. 01/10/24   Bergman, Meghan D, NP  esomeprazole (NEXIUM) 40 MG capsule Take 40 mg by mouth daily before breakfast. 12/21/16   [provider]  folic acid (FOLVITE) 1 MG tablet Take 1 mg by mouth daily. 04/15/15   [provider]  hydroxychloroquine (PLAQUENIL) 200 MG tablet Take 200 mg by mouth 2 (two) times daily. 04/15/15   [provider]  hydroxypropyl  methylcellulose / hypromellose (ISOPTO TEARS / GONIOVISC) 2.5 % ophthalmic solution Place 1 drop into both eyes daily as needed for dry eyes.    [provider]  Anselm Oil 350 MG CAPS Take 350 mg by mouth in the morning. 02/08/20   [provider]  methotrexate 50 MG/2ML injection Inject 25 mg into the muscle every Friday. 04/15/15   [provider]  Multiple Vitamins-Minerals (CENTRUM SILVER 50+MEN) TABS Take 1 tablet by mouth daily. 02/08/20   [provider]  niacin (TRUE VITAMIN B3) 500 MG tablet Take 500 mg by mouth in the morning and at bedtime.    [provider]  Niacin (VITAMIN B-3 PO) Take 1 tablet by mouth in the morning.    [provider]  Olmesartan-amLODIPine-HCTZ (TRIBENZOR) 40-5-12.5 MG TABS Take 1 tablet by mouth in the morning. 03/26/18   [provider]  OVER THE COUNTER MEDICATION Take 1 tablet by mouth 2 (two) times daily. Prostate plus/ Urino Zinc    [provider]  oxyCODONE-acetaminophen  (PERCOCET) 5-325 MG tablet Take 1-2 tablets by mouth every 4 (four) hours as needed. 01/10/24 01/09/25  Bergman, Meghan D, NP  polyethylene glycol (MIRALAX) 17 g packet Take 17 g by mouth daily. 01/10/24   Bergman, Meghan D, NP  predniSONE (DELTASONE) 5 MG tablet Take 5 mg by mouth in the morning. 01/30/18  [provider]  Probiotic Product (FLORAJEN3 PO) Take 1 capsule by mouth daily.    [provider]  sulfaSALAzine (AZULFIDINE) 500 MG tablet Take 1,000 mg by mouth 2 (two) times daily. 03/30/15   [provider]  tamsulosin (FLOMAX) 0.4 MG CAPS capsule Take 0.4 mg by mouth in the morning.    [provider]    Allergies: Prevnar 13 [pneumococcal 13-val conj vacc]    Review of Systems  Neurological:  Positive for weakness.    Updated Vital Signs BP (!) 158/82   Pulse 96   Temp 98.7 F (37.1 C) (Oral)   Resp (!) 24   Ht 1.651 m (5' 5)   Wt 69 kg   SpO2 99%   BMI 25.31  kg/m   Physical Exam Vitals and nursing note reviewed.  Constitutional:      Appearance: He is well-developed. He is not diaphoretic.  HENT:     Head: Normocephalic and atraumatic.     Right Ear: External ear normal.     Left Ear: External ear normal.  Eyes:     General: No scleral icterus.       Right eye: No discharge.        Left eye: No discharge.     Conjunctiva/sclera: Conjunctivae normal.  Neck:     Trachea: No tracheal deviation.  Cardiovascular:     Rate and Rhythm: Normal rate and regular rhythm.  Pulmonary:     Effort: Pulmonary effort is normal. No respiratory distress.     Breath sounds: Normal breath sounds. No stridor. No wheezing or rales.  Abdominal:     General: Bowel sounds are normal. There is no distension.     Palpations: Abdomen is soft.     Tenderness: There is no abdominal tenderness. There is no guarding or rebound.  Musculoskeletal:        General: No tenderness or deformity.     Cervical back: Neck supple.     Comments: Swelling lumbar spine region around incision site, drainage noted,    Skin:    General: Skin is warm and dry.     Findings: No rash.  Neurological:     General: No focal deficit present.     Mental Status: He is alert.     Cranial Nerves: No cranial nerve deficit, dysarthria or facial asymmetry.     Sensory: No sensory deficit.     Motor: No abnormal muscle tone or seizure activity.     Coordination: Coordination normal.     Comments: Difficulty lifting legs off the bed, can plantar flex, sensation intact  Psychiatric:        Mood and Affect: Mood normal.     (all labs ordered are listed, but only abnormal results are displayed) Labs Reviewed  CBC - Abnormal; Notable for the following components:      Result Value   WBC 13.3 (*)    RBC 3.82 (*)    Hemoglobin 12.8 (*)    HCT 38.5 (*)    MCV 100.8 (*)    All other components within normal limits  BASIC METABOLIC PANEL WITH GFR - Abnormal; Notable for the following  components:   Sodium 133 (*)    Chloride 95 (*)    Glucose, Bld 111 (*)    Anion gap 16 (*)    All other components within normal limits  URINALYSIS, ROUTINE W REFLEX MICROSCOPIC - Abnormal; Notable for the following components:   Ketones, ur 20 (*)  Protein, ur 30 (*)    All other components within normal limits  I-STAT CG4 LACTIC ACID, ED  I-STAT CG4 LACTIC ACID, ED    EKG: EKG Interpretation Date/Time:  Saturday January 20 2024 15:47:58 EDT Ventricular Rate:  113 PR Interval:    QRS Duration:  101 QT Interval:  315 QTC Calculation: 432 R Axis:   -18  Text Interpretation: poor data quality, ?sinus tach vs atrial flutter Ventricular premature complex Borderline left axis deviation Confirmed by Randol Simmonds [phone]) on 01/20/2024 3:52:54 PM  Radiology: [facility] Lumbar Spine Complete Result Date: 01/20/2024 CLINICAL DATA:  Pain, recent surgery. EXAM: LUMBAR SPINE - COMPLETE 4+ VIEW COMPARISON:  04/12/2023, 03/25/2010. FINDINGS: There is no evidence of acute lumbar spine fracture. Spinal fusion hardware and intervertebral disc spacers are noted at L4-L5. There is grade 1 anterolisthesis at L4-L5. There is multilevel intervertebral disc space narrowing, degenerative endplate changes, and facet arthropathy. Multiple calcifications are present in the right upper quadrant, likely cholelithiasis. Renal calculi are noted bilaterally. Prostatic seeds are noted. IMPRESSION: 1. No acute fracture in the lumbar spine. 2. 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 3. 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 4. Bilateral nephrolithiasis. 5. Cholelithiasis. Electronically Signed   By: Leita Birmingham M.D.   On: 01/20/2024 17:27     Procedures   Medications Ordered in the ED  lactated ringers  bolus 500 mL (0 mLs Intravenous Stopped 01/20/24 1705)    Clinical Course as of 01/20/24 1826  Sat Jan 20, 2024  1712 CBC(!) White count elevated at 13.  Metabolic panel normal [JK]   1734 X-ray does not show any acute abnormality at the site of the patient's hardware [JK]  1750 Case discussed with Dr Lindalee.  Will see pt in the ED [JK]    Clinical Course User Index [JK] Randol Simmonds, MD                                 Medical Decision Making Problems Addressed: Weakness: acute illness or injury that poses a threat to life or bodily functions  Amount and/or Complexity of Data Reviewed Labs: ordered. Decision-making details documented in ED Course. Radiology: ordered and independent interpretation performed.  Risk Decision regarding hospitalization.   Patient presented to the ED for evaluation increasing weakness after lumbar spine surgical procedure on October 20.  Patient has not noticed any fevers but has increasing drainage.  On exam he definitely has some swelling and drainage at his incision site.  He also is having increasing difficulty lifting his leg.  Concerned about the possibility of postoperative infection or hematoma/seroma with neurologic compromise.  I consulted with Dr. Gillie who evaluated the patient in the ED.  He will plan to take him to the OR and admitted to the hospital for further treatment     Final diagnoses:  Weakness    ED Discharge Orders     None          Randol Simmonds, MD 01/20/24 1827

## 2024-01-20 NOTE — H&P (Signed)
 Frank Zuniga is an 83 y.o. male.   Chief Complaint: weakness lower extremities, difficulty walking, draining wound HPI: status post lumbar fusion and decompression on 01/08/2024. Discharged on 01/10/2024. Initially was doing well but over last 3-4 days has had increasing difficulty walking, has fallen three times, and was to be seen in the office on Monday. He took one dose of Keflex prescribed by us  yesterday. No bowel and or bladder difficulties which are acute.   Past Medical History:  Diagnosis Date   Arthritis    Cancer [facility])    prostate   Family history of adverse reaction to anesthesia    Died with surgery; chest surgery   GERD (gastroesophageal reflux disease)    Hypertension     Past Surgical History:  Procedure Laterality Date   BACK SURGERY     HERNIA REPAIR     x 3   PROSTATE SURGERY     RIGHT/LEFT HEART CATH AND CORONARY ANGIOGRAPHY N/A 10/26/2023   Procedure: RIGHT/LEFT HEART CATH AND CORONARY ANGIOGRAPHY;  Surgeon: Darron Deatrice LABOR, MD;  Location: [facility] INVASIVE [facility] LAB;  Service: Cardiovascular;  Laterality: N/A;   TONSILLECTOMY      History reviewed. No pertinent family history. Social History:  reports that he quit smoking about 51 years ago. His smoking use included cigarettes. He has never used smokeless tobacco. He reports that he does not drink alcohol and does not use drugs.  Allergies:  Allergies  Allergen Reactions   Prevnar 13 [Pneumococcal 13-Val Conj Vacc] Rash    Fever, chills, Pain    (Not in a hospital admission)   Results for orders placed or performed during the hospital encounter of 01/20/24 (from the past 48 hours)  CBC     Status: Abnormal   Collection Time: 01/20/24  4:20 PM  Result Value Ref Range   WBC 13.3 (H) 4.0 - 10.5 K/uL   RBC 3.82 (L) 4.22 - 5.81 MIL/uL   Hemoglobin 12.8 (L) 13.0 - 17.0 g/dL   HCT 61.4 (L) 60.9 - 47.9 %   MCV 100.8 (H) 80.0 - 100.0 fL   MCH 33.5 26.0 - 34.0 pg   MCHC 33.2 30.0 - 36.0 g/dL   RDW 85.5 88.4 -  84.4 %   Platelets 288 150 - 400 K/uL   nRBC 0.0 0.0 - 0.2 %    Comment: Performed at [facility], [address], [place], [place] [address]  Basic metabolic panel     Status: Abnormal   Collection Time: 01/20/24  4:20 PM  Result Value Ref Range   Sodium 133 (L) 135 - 145 mmol/L   Potassium 3.5 3.5 - 5.1 mmol/L   Chloride 95 (L) 98 - 111 mmol/L   CO2 22 22 - 32 mmol/L   Glucose, Bld 111 (H) 70 - 99 mg/dL    Comment: Glucose reference range applies only to samples taken after fasting for at least 8 hours.   BUN 16 8 - 23 mg/dL   Creatinine, Ser 9.10 0.61 - 1.24 mg/dL   Calcium 9.4 8.9 - 89.6 mg/dL   GFR, Estimated >39 >39 mL/min    Comment: (NOTE) Calculated using the CKD-EPI Creatinine Equation (2021)    Anion gap 16 (H) 5 - 15    Comment: Performed at [facility], [address] [place], [place] [address]  I-Stat CG4 Lactic Acid     Status: None   Collection Time: 01/20/24  4:26 PM  Result Value Ref Range  Lactic Acid, Venous 1.9 0.5 - 1.9 mmol/L  Urinalysis, Routine w reflex microscopic -Urine, Clean Catch     Status: Abnormal   Collection Time: 01/20/24  5:09 PM  Result Value Ref Range   Color, Urine YELLOW YELLOW   APPearance CLEAR CLEAR   Specific Gravity, Urine 1.014 1.005 - 1.030   pH 6.0 5.0 - 8.0   Glucose, UA NEGATIVE NEGATIVE mg/dL   Hgb urine dipstick NEGATIVE NEGATIVE   Bilirubin Urine NEGATIVE NEGATIVE   Ketones, ur 20 (A) NEGATIVE mg/dL   Protein, ur 30 (A) NEGATIVE mg/dL   Nitrite NEGATIVE NEGATIVE   Leukocytes,Ua NEGATIVE NEGATIVE   RBC / HPF 0-5 0 - 5 RBC/hpf   WBC, UA 0-5 0 - 5 WBC/hpf   Bacteria, UA NONE SEEN NONE SEEN   Squamous Epithelial / HPF 0-5 0 - 5 /HPF   Mucus PRESENT     Comment: Performed at [facility], [address] [place], [place] [address]   DG Lumbar Spine Complete Result Date: 01/20/2024 CLINICAL DATA:  Pain, recent surgery. EXAM: LUMBAR SPINE - COMPLETE 4+ VIEW COMPARISON:  04/12/2023, 03/25/2010.  FINDINGS: There is no evidence of acute lumbar spine fracture. Spinal fusion hardware and intervertebral disc spacers are noted at L4-L5. There is grade 1 anterolisthesis at L4-L5. There is multilevel intervertebral disc space narrowing, degenerative endplate changes, and facet arthropathy. Multiple calcifications are present in the right upper quadrant, likely cholelithiasis. Renal calculi are noted bilaterally. Prostatic seeds are noted. IMPRESSION: 1. No acute fracture in the lumbar spine. 2. 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 3. 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 4. Bilateral nephrolithiasis. 5. Cholelithiasis. Electronically Signed   By: Leita Birmingham M.D.   On: 01/20/2024 17:27    Review of Systems  Constitutional:  Positive for fatigue.  HENT: Negative.    Eyes: Negative.   Respiratory: Negative.    Cardiovascular: Negative.   Gastrointestinal: Negative.   Endocrine: Negative.   Genitourinary: Negative.   Musculoskeletal:  Positive for back pain and gait problem.  Skin: Negative.   Allergic/Immunologic: Negative.   Neurological:  Positive for weakness.  Hematological: Negative.   Psychiatric/Behavioral: Negative.      Blood pressure (!) 158/82, pulse 96, temperature 98.7 F (37.1 C), temperature source Oral, resp. rate (!) 24, height 5' 5 (1.651 m), weight 69 kg, SpO2 99%. Physical Exam Constitutional:      General: He is in acute distress.     Appearance: He is ill-appearing.  HENT:     Head: Normocephalic and atraumatic.     Right Ear: External ear normal.     Left Ear: External ear normal.     Nose: Nose normal.     Mouth/Throat:     Mouth: Mucous membranes are dry.     Pharynx: Oropharynx is clear.  Eyes:     Extraocular Movements: Extraocular movements intact.     Conjunctiva/sclera: Conjunctivae normal.     Pupils: Pupils are equal, round, and reactive to light.  Cardiovascular:     Rate and Rhythm: Normal rate and  regular rhythm.  Pulmonary:     Effort: Pulmonary effort is normal.  Abdominal:     General: Abdomen is flat.  Musculoskeletal:        General: Normal range of motion.     Cervical back: Normal range of motion and neck supple.  Skin:    General: Skin is warm and dry.     Findings: Erythema present.  Neurological:     Mental Status: He is alert and oriented to person, place, and time.     Sensory: Sensory deficit present.     Motor: No weakness.     Coordination: Coordination is intact.     Deep Tendon Reflexes: Babinski sign absent on the right side. Babinski sign absent on the left side.     Comments: Decreased hearing 2/5 dorsiflexion, baseline per patient Decreased proprioception left foot Did not assess gait  Psychiatric:        Mood and Affect: Mood normal.        Behavior: Behavior normal.        Thought Content: Thought content normal.        Judgment: Judgment normal.      Assessment/Plan I will take Mr. Purk to the operating room to explore his wound which appears to be infected. Erythematous, warm, draining. Increased weakness in lower extremities. He clearly is not doing well. Will benefit from more intensive PT also during the admission.   Rockey Peru, MD 01/20/2024, 6:40 PM

## 2024-01-21 ENCOUNTER — Encounter (HOSPITAL_COMMUNITY): Payer: Self-pay | Admitting: Neurosurgery

## 2024-01-21 DIAGNOSIS — T8149XA Infection following a procedure, other surgical site, initial encounter: Secondary | ICD-10-CM | POA: Diagnosis not present

## 2024-01-21 LAB — CBC
HCT: 30.2 % — ABNORMAL LOW (ref 39.0–52.0)
Hemoglobin: 10.2 g/dL — ABNORMAL LOW (ref 13.0–17.0)
MCH: 33.4 pg (ref 26.0–34.0)
MCHC: 33.8 g/dL (ref 30.0–36.0)
MCV: 99 fL (ref 80.0–100.0)
Platelets: 240 K/uL (ref 150–400)
RBC: 3.05 MIL/uL — ABNORMAL LOW (ref 4.22–5.81)
RDW: 14.6 % (ref 11.5–15.5)
WBC: 8.6 K/uL (ref 4.0–10.5)
nRBC: 0 % (ref 0.0–0.2)

## 2024-01-21 LAB — CREATININE, SERUM
Creatinine, Ser: 0.85 mg/dL (ref 0.61–1.24)
GFR, Estimated: >60 mL/min (ref >60)

## 2024-01-21 MED ORDER — SODIUM CHLORIDE 0.9 % IV SOLN
2.0000 g | INTRAVENOUS | Status: DC
  Administered 2024-01-21: 2 g via INTRAVENOUS
  Filled 2024-01-21: qty 20

## 2024-01-21 NOTE — Progress Notes (Signed)
 Inpatient Rehab Admissions Coordinator Note:   Per PT patient was screened for CIR candidacy by Chantale Leugers SHAUNNA Yvone Cohens, CCC-SLP. At this time, pt appears to be a potential candidate for CIR. I will place an order for rehab consult for full assessment, per our protocol.  Please contact me any with questions.SABRA Tinnie Yvone Cohens, MS, CCC-SLP Admissions Coordinator [phone] 01/21/24 4:51 PM

## 2024-01-21 NOTE — Progress Notes (Signed)
 Bruna, patients spouse, called and told patient will be moving. No questions.

## 2024-01-21 NOTE — Evaluation (Signed)
 Physical Therapy Evaluation Patient Details Name: Frank Zuniga MRN: [id number] DOB: [birth year] Today's Date: 01/21/2024  History of Present Illness  Pt is an 83 y.o. male who presented 01/20/24 with progressive weakness and multiple falls. Pt is s/p L4-L5, L5-S1 PLIF 01/08/24. Pt found to have infected lumbar wound, s/p debridement 11/1. PMHx: AS, prostate CA, GERD, HTN  Clinical Impression  Pt presents with condition above and deficits mentioned below, see PT Problem List. Prior to his back surgery 01/08/24, pt was mod I using a rollator for functional mobility, living with his spouse in a 1-level house with a level entry. Currently, the pt is limited by back and bil leg pain. He displays fairly symmetrical weakness in his bil legs and reports diminished sensation at his L foot. The pt also displays deficits in power, balance, and activity tolerance. He is at high risk for falls, displaying x1 LOB when ambulating up to ~3 ft today due to his bil knees buckling, resulting in him needing maxA to recover and safely complete the transfer to the recliner. He required modA to transfer to stand and to transition sidelying to sit EOB. As the pt has had a drastic functional decline and is motivated to participate and improve despite the pain, he could greatly benefit from intensive inpatient rehab, > 3 hours/day. Will continue to follow acutely.      If plan is discharge home, recommend the following: Assistance with cooking/housework;Assist for transportation;A lot of help with walking and/or transfers;A lot of help with bathing/dressing/bathroom   Can travel by private vehicle        Equipment Recommendations BSC/3in1;Wheelchair (measurements PT);Wheelchair cushion (measurements PT) (pending progress)  Recommendations for Other Services  Rehab consult    Functional Status Assessment Patient has had a recent decline in their functional status and demonstrates the ability to make significant  improvements in function in a reasonable and predictable amount of time.     Precautions / Restrictions Precautions Precautions: Back;Fall Precaution Booklet Issued: Yes (comment) (provided during prior admission) Recall of Precautions/Restrictions: Intact Precaution/Restrictions Comments: wound vac at back; reviewed precautions Spinal Brace:  (no brace needed orders currently, but did have lumbar corset prior admission) Restrictions Weight Bearing Restrictions Per Provider Order: No      Mobility  Bed Mobility Overal bed mobility: Needs Assistance Bed Mobility: Rolling, Sidelying to Sit Rolling: Contact guard assist, Used rails Sidelying to sit: Mod assist, HOB elevated, Used rails       General bed mobility comments: Pt needed extra time and cues to flex legs and reach L UE to R rail to roll with extra effort, CGA for safety. ModA needed to ascend trunk to sit up R EOB, cuing pt to bring legs off EOB and push up    Transfers Overall transfer level: Needs assistance Equipment used: Rolling walker (2 wheels) Transfers: Sit to/from Stand, Bed to chair/wheelchair/BSC Sit to Stand: Mod assist   Step pivot transfers: Mod assist, Max assist       General transfer comment: Cues provided for hand placement, pt noted to lean posteriorly resting legs against bed during transfer to stand, needing cues to lean anteriorly and modA to power up to stand and balance. ModA initially needed for balance and RW management to step pivot to R from bed to chair but pt then had bil knee buckling, resulting in him needing maxA to recover and complete the transfer safely    Ambulation/Gait Ambulation/Gait assistance: Mod assist, Max assist Gait Distance (Feet): 3 Feet Assistive  device: Rolling walker (2 wheels) Gait Pattern/deviations: Step-to pattern, Decreased step length - right, Decreased step length - left, Decreased stride length, Knee flexed in stance - right, Knee flexed in stance - left,  Knees buckling, Trunk flexed Gait velocity: reduced Gait velocity interpretation: <1.31 ft/sec, indicative of household ambulator   General Gait Details: Pt initially only needed modA for balance and RW management, taking slow, small steps. As distance progressed, the pt began to flex more at trunk and knees and had x1 LOB when bil knees buckled, needing maxA to recover and safely complete step pivot to chair. VCs provided to extend legs and to sequence advancing RW.  Stairs            Wheelchair Mobility     Tilt Bed    Modified Rankin (Stroke Patients Only)       Balance Overall balance assessment: Needs assistance Sitting-balance support: No upper extremity supported, Feet supported Sitting balance-Leahy Scale: Fair Sitting balance - Comments: static sitting EOB with supervision for safety   Standing balance support: Bilateral upper extremity supported, During functional activity, Reliant on assistive device for balance Standing balance-Leahy Scale: Poor Standing balance comment: reliant on RW and external physical assistance, x1 LOB when knees buckled, needing maxA to recover                             Pertinent Vitals/Pain Pain Assessment Pain Assessment: Faces Faces Pain Scale: Hurts whole lot Pain Location: back, bil legs Pain Descriptors / Indicators: Discomfort, Grimacing, Guarding, Moaning, Operative site guarding Pain Intervention(s): Limited activity within patient's tolerance, Monitored during session, Premedicated before session, Repositioned    Home Living Family/patient expects to be discharged to:: Private residence Living Arrangements: Spouse/significant other Available Help at Discharge: Family;Available 24 hours/day Type of Home: House Home Access: Level entry       Home Layout: One level Home Equipment: Agricultural Consultant (2 wheels);Rollator (4 wheels);Cane - single point      Prior Function Prior Level of Function :  Independent/Modified Independent;Driving             Mobility Comments: rollator for all household distances prior to back surgery; several recent falls since last admission ADLs Comments: indep with ADLs & IADLs prior to back surgery     Extremity/Trunk Assessment   Upper Extremity Assessment Upper Extremity Assessment: Defer to OT evaluation    Lower Extremity Assessment Lower Extremity Assessment: Generalized weakness;RLE deficits/detail;LLE deficits/detail RLE Deficits / Details: MMT scores of </= 4- grossly throughout; denied deficits in sensation to light touch LLE Deficits / Details: MMT scores of </= 4- grossly throughout; reported diminished sensation at L foot LLE Sensation: decreased light touch    Cervical / Trunk Assessment Cervical / Trunk Assessment: Back Surgery  Communication   Communication Communication: Impaired Factors Affecting Communication: Hearing impaired    Cognition Arousal: Alert Behavior During Therapy: WFL for tasks assessed/performed   PT - Cognitive impairments: No apparent impairments                         Following commands: Intact       Cueing Cueing Techniques: Verbal cues, Tactile cues     General Comments General comments (skin integrity, edema, etc.): VSS on RA    Exercises     Assessment/Plan    PT Assessment Patient needs continued PT services  PT Problem List Decreased strength;Decreased activity tolerance;Decreased balance;Decreased mobility;Pain;Impaired sensation;Decreased skin  integrity       PT Treatment Interventions Gait training;DME instruction;Functional mobility training;Therapeutic activities;Therapeutic exercise;Balance training;Neuromuscular re-education;Patient/family education    PT Goals (Current goals can be found in the Care Plan section)  Acute Rehab PT Goals Patient Stated Goal: to improve PT Goal Formulation: With patient Time For Goal Achievement: 02/04/24 Potential to Achieve  Goals: Good    Frequency Min 5X/week     Co-evaluation               AM-PAC PT 6 Clicks Mobility  Outcome Measure Help needed turning from your back to your side while in a flat bed without using bedrails?: A Little Help needed moving from lying on your back to sitting on the side of a flat bed without using bedrails?: A Lot Help needed moving to and from a bed to a chair (including a wheelchair)?: A Lot Help needed standing up from a chair using your arms (e.g., wheelchair or bedside chair)?: A Lot Help needed to walk in hospital room?: Total (<20 ft) Help needed climbing 3-5 steps with a railing? : Total 6 Click Score: 11    End of Session Equipment Utilized During Treatment: Gait belt Activity Tolerance: Patient limited by pain Patient left: in chair;with call bell/phone within reach;with chair alarm set Nurse Communication: Mobility status;Need for lift equipment (+2 or use stedy) PT Visit Diagnosis: Unsteadiness on feet (R26.81);Other abnormalities of gait and mobility (R26.89);Muscle weakness (generalized) (M62.81);Difficulty in walking, not elsewhere classified (R26.2);History of falling (Z91.81);Pain Pain - Right/Left:  (back and legs) Pain - part of body: Leg (back)    Time: 8870-8841 PT Time Calculation (min) (ACUTE ONLY): 29 min   Charges:   PT Evaluation $PT Eval Moderate Complexity: 1 Mod PT Treatments $Therapeutic Activity: 8-22 mins PT General Charges $$ ACUTE PT VISIT: 1 Visit         Theo Ferretti, PT, DPT Acute Rehabilitation Services  Office: [phone]   Theo CHRISTELLA Ferretti 01/21/2024, 12:23 PM

## 2024-01-21 NOTE — Consult Note (Signed)
 [facility]    Date of Admission:  01/20/2024   Total days of inpatient antibiotics 1        Reason for Consult: Postop wound    Principal Problem:   Wound infection after surgery   Assessment: 83 year old male with history of prostate cancer, GERD, leg pain and spinal stenosis requiring index lumbar laminectomy/foraminotomy in March 2024, underwent redo of lumbar fusion and sacral laminectomy on 01/08/2024 Dr. Mavis had wound dehiscence admitted with #Postop vertebral infection with hardware in place - On 10/24 underwent: Redo bilateral L4-5 and right L5-S1 laminotomy/foraminotomies laminotomy/foraminotomies/medial facetectomy to decompress the bilateral L4, L5 - Per OR note wound was opened up and noted immediate purulent fluid which was sent for cultures.  Fascia was opened, pedicle screws inspected noted good purchase in the bone which was irrigated. -cultures with Gram stain with [facility] Recommendations:  -Continue vancomycin - Add ceftriaxone - Follow or cultures - Blood cultures, place picc 48 hours from negative blood cx. Will need atleast 8 weeks iv therapy + po suppression 3-6 months given fascia was opened although no concern noted around the hardware on review of the note - New ID service Monday Microbiology:   Antibiotics: Vancomycin 11/1-present  Cultures: Blood  Urine  Other 7/1  HPI: Frank Zuniga is a 83 y.o. male with past medical history of arthritis, prostate cancer, GERD, hypertension, leg pain and spinal stenosis requiring lumbar L4-5 laminectomyforaminotomies  05/21/2022 at [facility] then underwent redo lumbar fusion and sacral laminectomy on 10/24 requiring screws and rods placement with Dr. Mavis complicated by wound dehiscence requiring debridement.  OR findings notable for immediate purulence effluent from wound.  ID was engaged for antibiotic recommendation  Review of Systems: Review of Systems  All other systems  reviewed and are negative.   Past Medical History:  Diagnosis Date   Arthritis    Cancer [facility])    prostate   Family history of adverse reaction to anesthesia    Died with surgery; chest surgery   GERD (gastroesophageal reflux disease)    Hypertension     Social History   Tobacco Use   Smoking status: Former    Current packs/day: 0.00    Types: Cigarettes    Quit date: 1974    Years since quitting: 51.8   Smokeless tobacco: Never  Vaping Use   Vaping status: Never Used  Substance Use Topics   Alcohol use: Never   Drug use: Never    History reviewed. No pertinent family history. Scheduled Meds:  acetaminophen   1,000 mg Oral Q6H   irbesartan  300 mg Oral Daily   And   amLODipine  5 mg Oral Daily   And   hydrochlorothiazide  12.5 mg Oral Daily   aspirin  EC  81 mg Oral Daily   calcium carbonate  1,250 mg Oral BID WC   cholecalciferol  1,000 Units Oral BID   folic acid  1 mg Oral Daily   heparin  injection (subcutaneous)  5,000 Units Subcutaneous Q8H   hydroxychloroquine  200 mg Oral BID   loratadine  10 mg Oral Daily   multivitamin with minerals  1 tablet Oral Daily   niacin  500 mg Oral BID WC   pantoprazole  80 mg Oral Daily   polyethylene glycol  17 g Oral Daily   senna  1 tablet Oral BID   sodium chloride  flush  3 mL Intravenous Q12H   sulfaSALAzine  1,000 mg  Oral BID   tamsulosin  0.4 mg Oral Daily   Continuous Infusions:  sodium chloride      0.9 % NaCl with KCl 20 mEq / L Stopped (01/21/24 1258)   PRN Meds:.acetaminophen  **OR** acetaminophen , alum & mag hydroxide-simeth, bisacodyl, diazepam, hydroxypropyl methylcellulose / hypromellose, magnesium citrate, menthol **OR** phenol, morphine injection, ondansetron  **OR** ondansetron  (ZOFRAN ) IV, oxyCODONE, oxyCODONE, senna-docusate, sodium chloride  flush, zolpidem Allergies  Allergen Reactions   Prevnar 13 [Pneumococcal 13-Val Conj Vacc] Rash    Fever, chills, Pain    OBJECTIVE: Blood pressure (!) 109/57,  pulse 91, temperature 97.6 F (36.4 C), temperature source Oral, resp. rate 19, height 5' 5 (1.651 m), weight 69 kg, SpO2 97%.  Physical Exam Constitutional:      General: He is not in acute distress.    Appearance: He is normal weight. He is not toxic-appearing.  HENT:     Head: Normocephalic and atraumatic.     Right Ear: External ear normal.     Left Ear: External ear normal.     Nose: No congestion or rhinorrhea.     Mouth/Throat:     Mouth: Mucous membranes are moist.     Pharynx: Oropharynx is clear.  Eyes:     Extraocular Movements: Extraocular movements intact.     Conjunctiva/sclera: Conjunctivae normal.     Pupils: Pupils are equal, round, and reactive to light.  Cardiovascular:     Rate and Rhythm: Normal rate and regular rhythm.     Heart sounds: No murmur heard.    No friction rub. No gallop.  Pulmonary:     Effort: Pulmonary effort is normal.     Breath sounds: Normal breath sounds.  Abdominal:     General: Abdomen is flat. Bowel sounds are normal.     Palpations: Abdomen is soft.  Musculoskeletal:        General: No swelling.     Cervical back: Normal range of motion and neck supple.  Skin:    General: Skin is warm and dry.  Psychiatric:        Mood and Affect: Mood normal.     Lab Results Lab Results  Component Value Date   WBC 8.6 01/21/2024   HGB 10.2 (L) 01/21/2024   HCT 30.2 (L) 01/21/2024   MCV 99.0 01/21/2024   PLT 240 01/21/2024    Lab Results  Component Value Date   CREATININE 0.85 01/21/2024   BUN 16 01/20/2024   NA 133 (L) 01/20/2024   K 3.5 01/20/2024   CL 95 (L) 01/20/2024   CO2 22 01/20/2024   No results found for: ALT, AST, GGT, ALKPHOS, BILITOT     Frank Stank, MD [facility] 01/21/2024, 1:30 PM   Evaluation of this patient requires complex antimicrobial therapy evaluation and counseling + isolation needs for disease transmission risk assessment and  mitigation

## 2024-01-21 NOTE — Progress Notes (Signed)
 Patient ID: Frank Zuniga, male   DOB: Sep 18, 1940, 83 y.o.   MRN: [id number] BP (!) 109/57 (BP Location: Left Arm)   Pulse 91   Temp 97.6 F (36.4 C) (Oral)   Resp 19   Ht 5' 5 (1.651 m)   Wt 69 kg   SpO2 97%   BMI 25.31 kg/m  Alert, following commands. Slightly confused.  Moving all extremities well States he feels worse than preop Wound vac in place.  ID consult called.

## 2024-01-21 NOTE — Plan of Care (Signed)
  Problem: Education: Goal: Knowledge of General Education information will improve Description: Including pain rating scale, medication(s)/side effects and non-pharmacologic comfort measures Outcome: Progressing   Problem: Health Behavior/Discharge Planning: Goal: Ability to manage health-related needs will improve Outcome: Progressing   Problem: Clinical Measurements: Goal: Will remain free from infection Outcome: Progressing   Problem: Activity: Goal: Risk for activity intolerance will decrease Outcome: Progressing   Problem: Safety: Goal: Ability to remain free from injury will improve Outcome: Progressing   

## 2024-01-22 ENCOUNTER — Other Ambulatory Visit: Payer: Self-pay

## 2024-01-22 ENCOUNTER — Encounter: Payer: Self-pay | Admitting: *Deleted

## 2024-01-22 DIAGNOSIS — B958 Unspecified staphylococcus as the cause of diseases classified elsewhere: Secondary | ICD-10-CM

## 2024-01-22 DIAGNOSIS — B965 Pseudomonas (aeruginosa) (mallei) (pseudomallei) as the cause of diseases classified elsewhere: Secondary | ICD-10-CM

## 2024-01-22 DIAGNOSIS — L539 Erythematous condition, unspecified: Secondary | ICD-10-CM | POA: Insufficient documentation

## 2024-01-22 DIAGNOSIS — R531 Weakness: Secondary | ICD-10-CM | POA: Diagnosis not present

## 2024-01-22 DIAGNOSIS — T8149XD Infection following a procedure, other surgical site, subsequent encounter: Secondary | ICD-10-CM

## 2024-01-22 MED ORDER — SODIUM CHLORIDE 0.9 % IV SOLN
2.0000 g | Freq: Two times a day (BID) | INTRAVENOUS | Status: DC
  Administered 2024-01-22 – 2024-01-26 (×7): 2 g via INTRAVENOUS
  Filled 2024-01-22 (×7): qty 12.5

## 2024-01-22 MED ORDER — CEFAZOLIN SODIUM-DEXTROSE 2-4 GM/100ML-% IV SOLN
2.0000 g | Freq: Three times a day (TID) | INTRAVENOUS | Status: DC
  Administered 2024-01-22: 2 g via INTRAVENOUS
  Filled 2024-01-22: qty 100

## 2024-01-22 NOTE — Consult Note (Addendum)
 [facility]  Total days of antibiotics 2       Reason for Consult: early surgical site infection- staph aureus   Referring Physician: cabell  Principal Problem:   Wound infection after surgery  PROGRESS NOTE   HPI: Frank Zuniga is a 83 y.o. male with history of L4-L5 and Right L5-S1 fusion on 01/08/24 and started to note to have drainage on 10/26 where in the ED visit felt it was part of serous drainage. He started to have worsening lower extremity weakness, including ground level fall on 11/01 and thus was admitted for evaluation of his recent surgical incision, dehiscence, and surgical exploration. He underwent I x D on 11/1 and operative note commented that purulence was immediately encountered. He was started empirically on vancomycin and ceftriaxone. His OR cultures are showing staph aureus. 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 He denies fever or chills. Still weakness when trying to ambulate post I x D.  Past Medical History:  Diagnosis Date   Arthritis    Cancer [facility])    prostate   Family history of adverse reaction to anesthesia    Died with surgery; chest surgery   GERD (gastroesophageal reflux disease)    Hypertension     Allergies:  Allergies  Allergen Reactions   Prevnar 13 [Pneumococcal 13-Val Conj Vacc] Rash    Fever, chills, Pain    MEDICATIONS:  irbesartan  300 mg Oral Daily   And   amLODipine  5 mg Oral Daily   And   hydrochlorothiazide  12.5 mg Oral Daily   aspirin  EC  81 mg Oral Daily   calcium carbonate  1,250 mg Oral BID WC   cholecalciferol  1,000 Units Oral BID   folic acid  1 mg Oral Daily   heparin  injection (subcutaneous)  5,000 Units Subcutaneous Q8H   hydroxychloroquine  200 mg Oral BID   loratadine  10 mg Oral Daily   multivitamin with minerals  1 tablet Oral Daily   niacin  500 mg Oral BID WC   pantoprazole  80 mg Oral Daily   polyethylene glycol  17 g Oral Daily   senna  1 tablet Oral BID   sodium  chloride flush  3 mL Intravenous Q12H   sulfaSALAzine  1,000 mg Oral BID   tamsulosin  0.4 mg Oral Daily    Social History   Tobacco Use   Smoking status: Former    Current packs/day: 0.00    Types: Cigarettes    Quit date: 1974    Years since quitting: 51.8   Smokeless tobacco: Never  Vaping Use   Vaping status: Never Used  Substance Use Topics   Alcohol use: Never   Drug use: Never    History reviewed. No pertinent family history.  Review of Systems -  Back pain, still some weakness with standing. 12 point ros is otherwise negative  OBJECTIVE: Temp:  [96.9 F (36.1 C)-97.9 F (36.6 C)] 97.9 F (36.6 C) (11/03 0807) Pulse Rate:  [75-95] 95 (11/03 0807) Resp:  [14-20] 18 (11/03 0807) BP: (107-160)/(51-77) 160/77 (11/03 0807) SpO2:  [88 %-99 %] 97 % (11/03 0807) Physical Exam  Constitutional: He is oriented to person, place, and time. He appears well-developed and well-nourished. No distress.  HENT:  Mouth/Throat: Oropharynx is clear and moist. No oropharyngeal exudate.  Cardiovascular: Normal rate, regular rhythm and normal heart sounds. Exam reveals no gallop and no friction rub.  No murmur heard.  Pulmonary/Chest:  Effort normal and breath sounds normal. No respiratory distress. He has no wheezes.  Abdominal: Soft. Bowel sounds are normal. He exhibits no distension. There is no tenderness.  Lymphadenopathy:  He has no cervical adenopathy.  Neurological: He is alert and oriented to person, place, and time.  Skin: Skin is warm and dry. No rash noted. No erythema.  Psychiatric: He has a normal mood and affect. His behavior is normal.    LABS: Results for orders placed or performed during the hospital encounter of 01/20/24 (from the past 48 hours)  CBC     Status: Abnormal   Collection Time: 01/20/24  4:20 PM  Result Value Ref Range   WBC 13.3 (H) 4.0 - 10.5 K/uL   RBC 3.82 (L) 4.22 - 5.81 MIL/uL   Hemoglobin 12.8 (L) 13.0 - 17.0 g/dL   HCT 61.4 (L) 60.9 - 47.9  %   MCV 100.8 (H) 80.0 - 100.0 fL   MCH 33.5 26.0 - 34.0 pg   MCHC 33.2 30.0 - 36.0 g/dL   RDW 85.5 88.4 - 84.4 %   Platelets 288 150 - 400 K/uL   nRBC 0.0 0.0 - 0.2 %    Comment: Performed at [facility], [address] [place], [place] [address]  Basic metabolic panel     Status: Abnormal   Collection Time: 01/20/24  4:20 PM  Result Value Ref Range   Sodium 133 (L) 135 - 145 mmol/L   Potassium 3.5 3.5 - 5.1 mmol/L   Chloride 95 (L) 98 - 111 mmol/L   CO2 22 22 - 32 mmol/L   Glucose, Bld 111 (H) 70 - 99 mg/dL    Comment: Glucose reference range applies only to samples taken after fasting for at least 8 hours.   BUN 16 8 - 23 mg/dL   Creatinine, Ser 9.10 0.61 - 1.24 mg/dL   Calcium 9.4 8.9 - 89.6 mg/dL   GFR, Estimated >39 >39 mL/min    Comment: (NOTE) Calculated using the CKD-EPI Creatinine Equation (2021)    Anion gap 16 (H) 5 - 15    Comment: Performed at [facility], [address], [place], [place] [address]  I-Stat CG4 Lactic Acid     Status: None   Collection Time: 01/20/24  4:26 PM  Result Value Ref Range   Lactic Acid, Venous 1.9 0.5 - 1.9 mmol/L  Urinalysis, Routine w reflex microscopic -Urine, Clean Catch     Status: Abnormal   Collection Time: 01/20/24  5:09 PM  Result Value Ref Range   Color, Urine YELLOW YELLOW   APPearance CLEAR CLEAR   Specific Gravity, Urine 1.014 1.005 - 1.030   pH 6.0 5.0 - 8.0   Glucose, UA NEGATIVE NEGATIVE mg/dL   Hgb urine dipstick NEGATIVE NEGATIVE   Bilirubin Urine NEGATIVE NEGATIVE   Ketones, ur 20 (A) NEGATIVE mg/dL   Protein, ur 30 (A) NEGATIVE mg/dL   Nitrite NEGATIVE NEGATIVE   Leukocytes,Ua NEGATIVE NEGATIVE   RBC / HPF 0-5 0 - 5 RBC/hpf   WBC, UA 0-5 0 - 5 WBC/hpf   Bacteria, UA NONE SEEN NONE SEEN   Squamous Epithelial / HPF 0-5 0 - 5 /HPF   Mucus PRESENT     Comment: Performed at [facility], [address], [place], [place] [address]  I-Stat CG4 Lactic Acid     Status: Abnormal   Collection  Time: 01/20/24  6:52 PM  Result Value Ref Range   Lactic Acid, Venous  3.0 (HH) 0.5 - 1.9 mmol/L   Comment NOTIFIED PHYSICIAN   Aerobic/Anaerobic Culture w Gram Stain (surgical/deep wound)     Status: None (Preliminary result)   Collection Time: 01/20/24  8:07 PM   Specimen: Wound  Result Value Ref Range   Specimen Description WOUND    Special Requests NONE    Gram Stain      FEW WBC PRESENT, PREDOMINANTLY PMN FEW GRAM POSITIVE COCCI    Culture      FEW STAPHYLOCOCCUS AUREUS CULTURE REINCUBATED FOR BETTER GROWTH Performed at [facility], [address], [place], [place] [address]    Report Status PENDING    Organism ID, Bacteria STAPHYLOCOCCUS AUREUS       Susceptibility   Staphylococcus aureus - MIC*    CIPROFLOXACIN <=0.5 SENSITIVE Sensitive     ERYTHROMYCIN <=0.25 SENSITIVE Sensitive     GENTAMICIN <=0.5 SENSITIVE Sensitive     OXACILLIN 0.5 SENSITIVE Sensitive     TETRACYCLINE <=1 SENSITIVE Sensitive     VANCOMYCIN 1 SENSITIVE Sensitive     TRIMETH/SULFA <=10 SENSITIVE Sensitive     CLINDAMYCIN <=0.25 SENSITIVE Sensitive     RIFAMPIN <=0.5 SENSITIVE Sensitive     Inducible Clindamycin NEGATIVE Sensitive     LINEZOLID 2 SENSITIVE Sensitive     * FEW STAPHYLOCOCCUS AUREUS  CBC     Status: Abnormal   Collection Time: 01/21/24  4:16 AM  Result Value Ref Range   WBC 8.6 4.0 - 10.5 K/uL   RBC 3.05 (L) 4.22 - 5.81 MIL/uL   Hemoglobin 10.2 (L) 13.0 - 17.0 g/dL   HCT 69.7 (L) 60.9 - 47.9 %   MCV 99.0 80.0 - 100.0 fL   MCH 33.4 26.0 - 34.0 pg   MCHC 33.8 30.0 - 36.0 g/dL   RDW 85.3 88.4 - 84.4 %   Platelets 240 150 - 400 K/uL   nRBC 0.0 0.0 - 0.2 %    Comment: Performed at [facility], [address], [place], [place] [address]  Creatinine, serum     Status: None   Collection Time: 01/21/24  4:16 AM  Result Value Ref Range   Creatinine, Ser 0.85 0.61 - 1.24 mg/dL   GFR, Estimated >39 >39 mL/min    Comment: (NOTE) Calculated using the CKD-EPI Creatinine  Equation (2021) Performed at [facility], [address] [place], [place] [address]   Culture, blood (Routine X 2) w Reflex to ID Panel     Status: None (Preliminary result)   Collection Time: 01/21/24  3:13 PM   Specimen: BLOOD  Result Value Ref Range   Specimen Description BLOOD RIGHT ANTECUBITAL    Special Requests      BOTTLES DRAWN AEROBIC AND ANAEROBIC Blood Culture results may not be optimal due to an inadequate volume of blood received in culture bottles   Culture      NO GROWTH < 24 HOURS Performed at [facility], [address], [place], [place] [address]    Report Status PENDING   Culture, blood (Routine X 2) w Reflex to ID Panel     Status: None (Preliminary result)   Collection Time: 01/21/24  3:20 PM   Specimen: BLOOD  Result Value Ref Range   Specimen Description BLOOD LEFT ANTECUBITAL    Special Requests      BOTTLES DRAWN AEROBIC AND ANAEROBIC Blood Culture results may not be optimal due to an inadequate volume of blood received in culture bottles  Culture      NO GROWTH < 24 HOURS Performed at [facility], [address] [place], [place] [address]    Report Status PENDING     MICRO: Organism ID, Bacteria STAPHYLOCOCCUS AUREUS  Resulting Agency [facility]     Susceptibility   Staphylococcus aureus    MIC    CIPROFLOXACIN <=0.5 SENSI... Sensitive    CLINDAMYCIN <=0.25 SENS... Sensitive    ERYTHROMYCIN <=0.25 SENS... Sensitive    GENTAMICIN <=0.5 SENSI... Sensitive    Inducible Clindamycin NEGATIVE Sensitive    LINEZOLID 2 SENSITIVE Sensitive    OXACILLIN 0.5 SENSITIVE Sensitive    RIFAMPIN <=0.5 SENSI... Sensitive    TETRACYCLINE <=1 SENSITIVE Sensitive    TRIMETH/SULFA <=10 SENSIT... Sensitive    VANCOMYCIN 1 SENSITIVE Sensitive      IMAGING: No results found.  HISTORICAL MICRO/IMAGING  Assessment/Plan: early staph aureus and pseudomonas surgical site infection with lumbar fusion HW in place s/p I x D. Blood cx NGTD at  24hrs.  Anticipate that he will need 6 wk of iv abtx Will need picc line. Can place tomorrow afternoon if blood cx remain NGTD at 48hrs Will check sed rate and crp, as well as follow up on cultures sensitivities -will change for cefazolin  to cefepime for the time being since pseudomonas also isolated in cultures - should he have bacteremia, he will need TTE  Lower extremity weakness = will need PT/OT eval. Concern for fall risk. Continue with bed alarm  evaluation of this patient requires complex antimicrobial therapy evaluation and counseling and isolation needs for disease transmission risk assessment and mitigation.   I personally spent a total of 60 minutes in the care of the patient today including preparing to see the patient, getting/reviewing separately obtained history, performing a medically appropriate exam/evaluation, counseling and educating, placing orders, documenting clinical information in the EHR, independently interpreting results, and communicating results.

## 2024-01-22 NOTE — Evaluation (Signed)
 Occupational Therapy Evaluation Patient Details Name: Frank Zuniga MRN: [id number] DOB: Oct 12, 1940 Today's Date: 01/22/2024   History of Present Illness   Pt is an 83 y.o. male who presented 01/20/24 with progressive weakness and multiple falls. Pt is s/p L4-L5, L5-S1 PLIF 01/08/24. Pt found to have infected lumbar wound, s/p debridement 11/1. PMHx: AS, prostate CA, GERD, HTN     Clinical Impressions Prior to back surgery 01/08/24, Pt was independent with ADL and IADL tasks and Mod I with rollator for functional mobility. Pt currently requiring up to Max A +2 for functional transfers and up to Max A for ADL tasks. Pt is primarily limited by pain, generalized weakness, decreased activity tolerance, back precautions, and unsteadiness on feet. OT will continue to follow Pt acutely to facilitate progress towards functional goals. Patient will benefit from intensive inpatient follow-up therapy, >3 hours/day to maximize functional abilities and increase independence for safe return home.       If plan is discharge home, recommend the following:   Two people to help with walking and/or transfers;A lot of help with bathing/dressing/bathroom;Assistance with cooking/housework;Help with stairs or ramp for entrance;Assist for transportation     Functional Status Assessment   Patient has had a recent decline in their functional status and demonstrates the ability to make significant improvements in function in a reasonable and predictable amount of time.     Equipment Recommendations   BSC/3in1;Wheelchair (measurements OT);Wheelchair cushion (measurements OT)     Recommendations for Other Services   Rehab consult     Precautions/Restrictions   Precautions Precautions: Back;Fall Precaution Booklet Issued: Yes (comment) (Pt received booklet with previous admission, able to recall all precautions) Recall of Precautions/Restrictions: Intact Precaution/Restrictions Comments: wound vac at  back; reviewed precautions Spinal Brace: Other (comment) Spinal Brace Comments: no brace needed orders currently, but did have lumbar corset prior admission Restrictions Weight Bearing Restrictions Per Provider Order: No     Mobility Bed Mobility Overal bed mobility: Needs Assistance Bed Mobility: Rolling, Sidelying to Sit Rolling: Contact guard assist, Used rails Sidelying to sit: Mod assist, Used rails, +2 for physical assistance       General bed mobility comments: Pt required increased time and vebal cues to sequence log roll steps. CGA for rolling and Mod A to raise trunk from bed. Strong posterior lean initially in sitting. Pt required dense verbal cues to not hold breathe during bed mobility.    Transfers Overall transfer level: Needs assistance Equipment used: Rolling walker (2 wheels) Transfers: Sit to/from Stand, Bed to chair/wheelchair/BSC Sit to Stand: Max assist, +2 physical assistance, From elevated surface     Step pivot transfers: Mod assist, Max assist, +2 physical assistance     General transfer comment: Pt with two sit to stand attempts. Initially unable to extend legs and trunk to maintain standing balance for more than 5 seconds, bilateral knee buckling observed. On second attempt, bed height elevated and Pt provided with assist to rock before standing with improved power up. Cues required for hand placement and initiation of taking steps. Mod A +2 initially to maintain standing balance, transitioned to Max A +2 to complete transfer safely with bilateral knee buckling and posterior lean. Mod A to manage RW.      Balance Overall balance assessment: Needs assistance Sitting-balance support: Single extremity supported, Feet supported Sitting balance-Leahy Scale: Fair Sitting balance - Comments: static sitting balance EOB with CGA for safety Postural control: Posterior lean Standing balance support: Bilateral upper extremity supported, During functional  activity, Reliant on assistive device for balance Standing balance-Leahy Scale: Poor Standing balance comment: reliant on RW and external physical assist. unable to recover without physical assistance.                           ADL either performed or assessed with clinical judgement   ADL Overall ADL's : Needs assistance/impaired Eating/Feeding: Independent   Grooming: Contact guard assist;Sitting   Upper Body Bathing: Moderate assistance;Sitting   Lower Body Bathing: Maximal assistance   Upper Body Dressing : Minimal assistance;Sitting   Lower Body Dressing: Maximal assistance;Sitting/lateral leans   Toilet Transfer: Maximal assistance;+2 for physical assistance;Stand-pivot;BSC/3in1   Toileting- Clothing Manipulation and Hygiene: Total assistance       Functional mobility during ADLs: Maximal assistance;+2 for physical assistance;Rolling walker (2 wheels)       Vision Baseline Vision/History: 1 Wears glasses Patient Visual Report: No change from baseline Vision Assessment?: No apparent visual deficits     Perception         Praxis         Pertinent Vitals/Pain Pain Assessment Pain Assessment: 0-10 Pain Score: 4  Pain Location: back Pain Descriptors / Indicators: Discomfort, Grimacing, Guarding, Sharp Pain Intervention(s): Limited activity within patient's tolerance, Monitored during session     Extremity/Trunk Assessment Upper Extremity Assessment Upper Extremity Assessment: Generalized weakness   Lower Extremity Assessment Lower Extremity Assessment: Defer to PT evaluation   Cervical / Trunk Assessment Cervical / Trunk Assessment: Back Surgery   Communication Communication Communication: Impaired Factors Affecting Communication: Hearing impaired   Cognition Arousal: Alert Behavior During Therapy: WFL for tasks assessed/performed Cognition: No apparent impairments                               Following commands: Intact        Cueing  General Comments   Cueing Techniques: Verbal cues;Tactile cues  VSS on RA   Exercises     Shoulder Instructions      Home Living Family/patient expects to be discharged to:: Private residence Living Arrangements: Spouse/significant other Available Help at Discharge: Family;Available 24 hours/day (son lives close, wife is weak per Pt sttaement) Type of Home: House Home Access: Level entry     Home Layout: One level     Bathroom Shower/Tub: Producer, Television/film/video: Standard Bathroom Accessibility: Yes How Accessible: Accessible via walker (from previous encounter) Home Equipment: Rolling Walker (2 wheels);Rollator (4 wheels);Cane - single point;Shower seat   Additional Comments: Was at [facility] after initial back surgery      Prior Functioning/Environment Prior Level of Function : Independent/Modified Independent;Driving             Mobility Comments: rollator for all household distances prior to back surgery; several recent falls since last admission ADLs Comments: indep with ADLs & IADLs prior to back surgery. Receieved assistance with ADLs aftr back sx.    OT Problem List: Decreased strength;Impaired balance (sitting and/or standing);Decreased activity tolerance;Decreased safety awareness;Decreased knowledge of use of DME or AE;Decreased knowledge of precautions;Pain   OT Treatment/Interventions: Self-care/ADL training;Therapeutic exercise;Energy conservation;DME and/or AE instruction;Therapeutic activities;Patient/family education;Balance training      OT Goals(Current goals can be found in the care plan section)   Acute Rehab OT Goals Patient Stated Goal: to get better OT Goal Formulation: With patient Time For Goal Achievement: 02/05/24 Potential to Achieve Goals: Good ADL Goals Pt Will Perform Upper Body  Bathing: with contact guard assist;with adaptive equipment;sitting Pt Will Perform Lower Body Bathing: with min assist;with  adaptive equipment;sitting/lateral leans Pt Will Perform Lower Body Dressing: with min assist;with adaptive equipment;sitting/lateral leans Pt Will Transfer to Toilet: with min assist;bedside commode Pt Will Perform Toileting - Clothing Manipulation and hygiene: with min assist;sitting/lateral leans Additional ADL Goal #1: Pt will engage in bed mobility with CGA as a precursor to ADLs   OT Frequency:  Min 2X/week    Co-evaluation              AM-PAC OT 6 Clicks Daily Activity     Outcome Measure Help from another person eating meals?: None Help from another person taking care of personal grooming?: A Little Help from another person toileting, which includes using toliet, bedpan, or urinal?: Total Help from another person bathing (including washing, rinsing, drying)?: A Lot Help from another person to put on and taking off regular upper body clothing?: A Little Help from another person to put on and taking off regular lower body clothing?: A Lot 6 Click Score: 15   End of Session Equipment Utilized During Treatment: Gait belt;Rollator (4 wheels) Nurse Communication: Mobility status  Activity Tolerance: Patient tolerated treatment well Patient left: in chair;with call bell/phone within reach;with chair alarm set  OT Visit Diagnosis: Unsteadiness on feet (R26.81);Muscle weakness (generalized) (M62.81);History of falling (Z91.81);Pain Pain - part of body:  (back)                Time: 9190-9161 OT Time Calculation (min): 29 min Charges:  OT General Charges $OT Visit: 1 Visit OT Evaluation $OT Eval Moderate Complexity: 1 Mod OT Treatments $Therapeutic Activity: 8-22 mins  Frank Zuniga, OTR/L.  [facility]  Office: [phone]   Frank Zuniga 01/22/2024, 11:15 AM

## 2024-01-22 NOTE — Progress Notes (Signed)
 Patient ID: Frank Zuniga, male   DOB: [birth year], 83 y.o.   MRN: [id number] Subjective: The patient is alert and pleasant.  He is in no apparent distress.  Objective: Vital signs in last 24 hours: Temp:  [96.9 F (36.1 C)-97.9 F (36.6 C)] 97.9 F (36.6 C) (11/03 0807) Pulse Rate:  [83-95] 95 (11/03 0807) Resp:  [15-20] 18 (11/03 0807) BP: (107-160)/(51-77) 160/77 (11/03 0807) SpO2:  [88 %-97 %] 97 % (11/03 0807) Estimated body mass index is 25.31 kg/m as calculated from the following:   Height as of this encounter: 5' 5 (1.651 m).   Weight as of this encounter: 69 kg.   Intake/Output from previous day: 11/02 0701 - 11/03 0700 In: 1101 [I.V.:1001; IV Piggyback:100] Out: 1550 [Urine:1350; Drains:200] Intake/Output this shift: Total I/O In: 240 [P.O.:240] Out: 400 [Urine:400]  Physical exam patient is alert and pleasant.  He is moving his lower extremities well.  Lab Results: Recent Labs    01/20/24 1620 01/21/24 0416  WBC 13.3* 8.6  HGB 12.8* 10.2*  HCT 38.5* 30.2*  PLT 288 240   BMET Recent Labs    01/20/24 1620 01/21/24 0416  NA 133*  --   K 3.5  --   CL 95*  --   CO2 22  --   GLUCOSE 111*  --   BUN 16  --   CREATININE 0.89 0.85  CALCIUM 9.4  --     Studies/Results: US  EKG SITE RITE Result Date: 01/22/2024 If Site Rite image not attached, placement could not be confirmed due to current cardiac rhythm.   Assessment/Plan: Postop day #2/wound infection: The patient's cultures are growing staph aureus.  ID has seen the patient.  I appreciate their help.  We are awaiting sensitivities.  I have explained the treatment plan to the patient, i.e. await sensitivities to determine final antibiotic treatment, PICC line, home IV antibiotics, excetra.  I have answered all his questions.  LOS: 2 days     [name] 01/22/2024, 12:41 PM

## 2024-01-22 NOTE — PMR Pre-admission (Shared)
 PMR Admission Coordinator Pre-Admission Assessment  Patient: Frank Zuniga is an 83 y.o., male MRN: [id number] DOB: 01-16-1941 Height: 5' 5 (165.1 cm) Weight: 69 kg  Insurance Information HMO:   yes  PPO:      PCP:      IPA:      80/20:      OTHER:  PRIMARY: [employer]      Policy#: [id number]       Subscriber:  CM Name: ***      Phone#: ***     Fax#: *** Pre-Cert#: ***      Employer: *** Benefits:  Phone #: [phone]     Name: sonu Eff. Date: 03/22/23     Deduct: 0      Out of Pocket Max: $3,400 ($1,048.23)      Life Max: n/a CIR: $325/day days 1-6   SNF: $0 days 1-20, $214 days 20-100 Outpatient: $15/visit     Home Health: $       DME:  Providers: In network  SECONDARY:       Policy#:      Phone#:   Artist:       Phone#:   The Data Processing Manager" for patients in Inpatient Rehabilitation Facilities with attached "Privacy Act Statement-Health Care Records" was provided and verbally reviewed with: Patient  Emergency Contact Information Contact Information     Name Relation Home Work [place] Other   [phone]   Shen,Patricia Spouse [phone]  [phone]      Other Contacts     Name Relation Home Work Mobile   Dimitrious, Micciche   [phone]       Current Medical History  Patient Admitting Diagnosis: Lumbar Radiculopathy, infection post op History of Present Illness: The patient is an 83 year old white male with past medical history of prostate cancer, rheumatoid arthritis, dyslipidemia, and lumber radiculopathy who underwent L4-5 decompression instrumentation and fusion and right L5-S1 laminotomy/foraminotomies on 01/08/24. He initially discharged to [facility]. He went for neurosurgery follow up and was noted to have difficulty walking. Dr. Gillie felt wound was infected, He was brought back to the OR at [facility] on 03/22/23 and underwent lumbar wound debridement with would vac placement. He was  seen by PT/OT post operatively and they recommended CIR to assist return to PLOF.   Leita Kleine, MS, CCC-SLP Rehab Admissions Coordinator  [phone] (celll) [phone] (office)     Patient's medical record from [facility]  has been reviewed by the rehabilitation admission coordinator and physician.  Past Medical History  Past Medical History:  Diagnosis Date   Arthritis    Cancer [facility])    prostate   Family history of adverse reaction to anesthesia    Died with surgery; chest surgery   GERD (gastroesophageal reflux disease)    Hypertension     Has the patient had major surgery during 100 days prior to admission? Yes  Family History   family history is not on file.  Current Medications  Current Facility-Administered Medications:    0.9 % NaCl with KCl 20 mEq/ L  infusion, , Intravenous, Continuous, Cabbell, Kyle, MD, Stopped at 01/21/24 1255   acetaminophen  (TYLENOL ) tablet 650 mg, 650 mg, Oral, Q4H PRN, 650 mg at 01/21/24 0144 **OR** acetaminophen  (TYLENOL ) suppository 650 mg, 650 mg, Rectal, Q4H PRN, Cabbell, Kyle, MD   alum & mag hydroxide-simeth (MAALOX/MYLANTA) 200-200-20 MG/5ML suspension 30 mL, 30 mL, Oral, Q6H PRN, Cabbell, Kyle, MD   irbesartan (  AVAPRO) tablet 300 mg, 300 mg, Oral, Daily, 300 mg at 01/22/24 0941 **AND** amLODipine (NORVASC) tablet 5 mg, 5 mg, Oral, Daily, 5 mg at 01/22/24 0941 **AND** hydrochlorothiazide (HYDRODIURIL) tablet 12.5 mg, 12.5 mg, Oral, Daily, Cabbell, Kyle, MD, 12.5 mg at 01/22/24 0941   aspirin  EC tablet 81 mg, 81 mg, Oral, Daily, Cabbell, Kyle, MD, 81 mg at 01/22/24 0941   bisacodyl (DULCOLAX) EC tablet 5 mg, 5 mg, Oral, Daily PRN, Cabbell, Kyle, MD   calcium carbonate (OS-CAL - dosed in mg of elemental calcium) tablet 1,250 mg, 1,250 mg, Oral, BID WC, Cabbell, Kyle, MD, 1,250 mg at 01/22/24 0941   ceFAZolin  (ANCEF ) IVPB 2g/100 mL premix, 2 g, Intravenous, Q8H, Luiz Channel, MD, Last Rate: 200 mL/hr at 01/22/24  1301, 2 g at 01/22/24 1301   cholecalciferol (VITAMIN D3) 25 MCG (1000 UNIT) tablet 1,000 Units, 1,000 Units, Oral, BID, Cabbell, Kyle, MD, 1,000 Units at 01/22/24 0941   diazepam (VALIUM) tablet 2 mg, 2 mg, Oral, Q6H PRN, Cabbell, Kyle, MD, 2 mg at 01/21/24 0510   folic acid (FOLVITE) tablet 1 mg, 1 mg, Oral, Daily, Cabbell, Kyle, MD, 1 mg at 01/22/24 0941   heparin  injection 5,000 Units, 5,000 Units, Subcutaneous, Q8H, Cabbell, Kyle, MD, 5,000 Units at 01/22/24 1255   hydroxychloroquine (PLAQUENIL) tablet 200 mg, 200 mg, Oral, BID, Cabbell, Kyle, MD, 200 mg at 01/22/24 0941   hydroxypropyl methylcellulose / hypromellose (ISOPTO TEARS / GONIOVISC) 2.5 % ophthalmic solution 1 drop, 1 drop, Both Eyes, Daily PRN, Cabbell, Kyle, MD   loratadine (CLARITIN) tablet 10 mg, 10 mg, Oral, Daily, Cabbell, Kyle, MD, 10 mg at 01/22/24 0941   magnesium citrate solution 1 Bottle, 1 Bottle, Oral, Once PRN, Cabbell, Kyle, MD   menthol (CEPACOL) lozenge 3 mg, 1 lozenge, Oral, PRN **OR** phenol (CHLORASEPTIC) mouth spray 1 spray, 1 spray, Mouth/Throat, PRN, Cabbell, Kyle, MD   morphine (PF) 2 MG/ML injection 1 mg, 1 mg, Intravenous, Q2H PRN, Cabbell, Kyle, MD   multivitamin with minerals tablet 1 tablet, 1 tablet, Oral, Daily, Cabbell, Kyle, MD, 1 tablet at 01/22/24 0941   niacin (VITAMIN B3) tablet 500 mg, 500 mg, Oral, BID WC, Cabbell, Kyle, MD, 500 mg at 01/21/24 1801   ondansetron  (ZOFRAN ) tablet 4 mg, 4 mg, Oral, Q6H PRN **OR** ondansetron  (ZOFRAN ) injection 4 mg, 4 mg, Intravenous, Q6H PRN, Cabbell, Kyle, MD   oxyCODONE (Oxy IR/ROXICODONE) immediate release tablet 10 mg, 10 mg, Oral, Q3H PRN, Cabbell, Kyle, MD, 10 mg at 01/22/24 0426   oxyCODONE (Oxy IR/ROXICODONE) immediate release tablet 5 mg, 5 mg, Oral, Q3H PRN, Cabbell, Kyle, MD   pantoprazole (PROTONIX) EC tablet 80 mg, 80 mg, Oral, Daily, Cabbell, Rockey, MD, 80 mg at 01/22/24 0941   polyethylene glycol (MIRALAX / GLYCOLAX) packet 17 g, 17 g, Oral, Daily,  Cabbell, Kyle, MD, 17 g at 01/22/24 0940   senna (SENOKOT) tablet 8.6 mg, 1 tablet, Oral, BID, Cabbell, Kyle, MD, 8.6 mg at 01/22/24 0941   senna-docusate (Senokot-S) tablet 1 tablet, 1 tablet, Oral, QHS PRN, Cabbell, Kyle, MD   sodium chloride  flush (NS) 0.9 % injection 3 mL, 3 mL, Intravenous, Q12H, Cabbell, Kyle, MD, 3 mL at 01/22/24 0943   sodium chloride  flush (NS) 0.9 % injection 3 mL, 3 mL, Intravenous, PRN, Cabbell, Kyle, MD   sulfaSALAzine (AZULFIDINE) tablet 1,000 mg, 1,000 mg, Oral, BID, Cabbell, Kyle, MD, 1,000 mg at 01/22/24 0940   tamsulosin (FLOMAX) capsule 0.4 mg, 0.4 mg, Oral, Daily, Cabbell, Kyle, MD, 0.4 mg  at 01/22/24 0941   zolpidem (AMBIEN) tablet 5 mg, 5 mg, Oral, QHS PRN, Cabbell, Kyle, MD  Patients Current Diet:  Diet Order             Diet regular Room service appropriate? Yes; Fluid consistency: Thin  Diet effective now                   Precautions / Restrictions Precautions Precautions: Back, Fall Precaution Booklet Issued: Yes (comment) (Pt received booklet with previous admission, able to recall all precautions) Precaution/Restrictions Comments: wound vac at back; reviewed precautions Spinal Brace: Other (comment) Spinal Brace Comments: no brace needed orders currently, but did have lumbar corset prior admission Restrictions Weight Bearing Restrictions Per Provider Order: No   Has the patient had 2 or more falls or a fall with injury in the past year? Yes  Prior Activity Level Community (5-7x/wk): Pt. active in the community PTA  Prior Functional Level Self Care: Did the patient need help bathing, dressing, using the toilet or eating? Independent  Indoor Mobility: Did the patient need assistance with walking from room to room (with or without device)? Independent  Stairs: Did the patient need assistance with internal or external stairs (with or without device)? Independent  Functional Cognition: Did the patient need help planning regular  tasks such as shopping or remembering to take medications? Independent  Patient Information Are you of Hispanic, Latino/a,or Spanish origin?: A. No, not of Hispanic, Latino/a, or Spanish origin What is your race?: A. White Do you need or want an interpreter to communicate with a doctor or health care staff?: 0. No  Patient's Response To:  Health Literacy and Transportation Is the patient able to respond to health literacy and transportation needs?: Yes Health Literacy - How often do you need to have someone help you when you read instructions, pamphlets, or other written material from your doctor or pharmacy?: Never In the past 12 months, has lack of transportation kept you from medical appointments or from getting medications?: No In the past 12 months, has lack of transportation kept you from meetings, work, or from getting things needed for daily living?: No  Home Assistive Devices / Equipment Home Equipment: Agricultural Consultant (2 wheels), Rollator (4 wheels), [employer] - single point, Information systems manager  Prior Device Use: Indicate devices/aids used by the patient prior to current illness, exacerbation or injury? Walker  Current Functional Level Cognition  Orientation Level: Oriented X4    Extremity Assessment (includes Sensation/Coordination)  Upper Extremity Assessment: Generalized weakness  Lower Extremity Assessment: Defer to PT evaluation RLE Deficits / Details: MMT scores of </= 4- grossly throughout; denied deficits in sensation to light touch LLE Deficits / Details: MMT scores of </= 4- grossly throughout; reported diminished sensation at L foot LLE Sensation: decreased light touch    ADLs  Overall ADL's : Needs assistance/impaired Eating/Feeding: Independent Grooming: Contact guard assist, Sitting Upper Body Bathing: Moderate assistance, Sitting Lower Body Bathing: Maximal assistance Upper Body Dressing : Minimal assistance, Sitting Lower Body Dressing: Maximal assistance,  Sitting/lateral leans Toilet Transfer: Maximal assistance, +2 for physical assistance, Stand-pivot, BSC/3in1 Toileting- Clothing Manipulation and Hygiene: Total assistance Functional mobility during ADLs: Maximal assistance, +2 for physical assistance, Rolling walker (2 wheels)    Mobility  Overal bed mobility: Needs Assistance Bed Mobility: Rolling, Sidelying to Sit Rolling: Contact guard assist, Used rails Sidelying to sit: Mod assist, Used rails, +2 for physical assistance General bed mobility comments: Pt required increased time and vebal cues to sequence log  roll steps. CGA for rolling and Mod A to raise trunk from bed. Strong posterior lean initially in sitting. Pt required dense verbal cues to not hold breathe during bed mobility.    Transfers  Overall transfer level: Needs assistance Equipment used: Rolling walker (2 wheels) Transfers: Sit to/from Stand, Bed to chair/wheelchair/BSC Sit to Stand: Max assist, +2 physical assistance, From elevated surface Bed to/from chair/wheelchair/BSC transfer type:: Step pivot Step pivot transfers: Mod assist, Max assist, +2 physical assistance General transfer comment: Pt with two sit to stand attempts. Initially unable to extend legs and trunk to maintain standing balance for more than 5 seconds, bilateral knee buckling observed. On second attempt, bed height elevated and Pt provided with assist to rock before standing with improved power up. Cues required for hand placement and initiation of taking steps. Mod A +2 initially to maintain standing balance, transitioned to Max A +2 to complete transfer safely with bilateral knee buckling and posterior lean. Mod A to manage RW.    Ambulation / Gait / Stairs / Wheelchair Mobility  Ambulation/Gait Ambulation/Gait assistance: Mod assist, Max assist Gait Distance (Feet): 3 Feet Assistive device: Rolling walker (2 wheels) Gait Pattern/deviations: Step-to pattern, Decreased step length - right, Decreased  step length - left, Decreased stride length, Knee flexed in stance - right, Knee flexed in stance - left, Knees buckling, Trunk flexed General Gait Details: Pt initially only needed modA for balance and RW management, taking slow, small steps. As distance progressed, the pt began to flex more at trunk and knees and had x1 LOB when bil knees buckled, needing maxA to recover and safely complete step pivot to chair. VCs provided to extend legs and to sequence advancing RW. Gait velocity: reduced Gait velocity interpretation: <1.31 ft/sec, indicative of household ambulator    Posture / Balance Dynamic Sitting Balance Sitting balance - Comments: static sitting balance EOB with CGA for safety Balance Overall balance assessment: Needs assistance Sitting-balance support: Single extremity supported, Feet supported Sitting balance-Leahy Scale: Fair Sitting balance - Comments: static sitting balance EOB with CGA for safety Postural control: Posterior lean Standing balance support: Bilateral upper extremity supported, During functional activity, Reliant on assistive device for balance Standing balance-Leahy Scale: Poor Standing balance comment: reliant on RW and external physical assist. unable to recover without physical assistance.    Special considerations/life events  Continuous Drip IV  ***, Wound Vac ***, and Skin ***   Previous Home Environment (from acute therapy documentation) Living Arrangements: Spouse/significant other Available Help at Discharge: Family, Available 24 hours/day (son lives close, wife is weak per Pt sttaement) Type of Home: House Home Layout: One level Home Access: Level entry Bathroom Shower/Tub: Health Visitor: Standard Bathroom Accessibility: Yes How Accessible: Accessible via walker (from previous encounter) Additional Comments: Was at [facility] after initial back surgery  Discharge Living Setting Plans for Discharge Living Setting: Patient's  home Type of Home at Discharge: House Discharge Home Layout: One level Discharge Home Access: Level entry Discharge Bathroom Shower/Tub: Tub/shower unit, Walk-in shower Discharge Bathroom Toilet: Standard Discharge Bathroom Accessibility: Yes How Accessible: Accessible via walker  Social/Family/Support Systems Patient Roles: Spouse Contact Information: [phone] Anticipated Caregiver: Jamoni Hewes Anticipated Caregiver's Contact Information: Can do light min A, 24/7 Caregiver Availability: 24/7 Discharge Plan Discussed with Primary Caregiver: Yes Is Caregiver In Agreement with Plan?: Yes Does Caregiver/Family have Issues with Lodging/Transportation while Pt is in Rehab?: No  Goals Patient/Family Goal for Rehab: PT/OT Supervision Expected length of stay: 16-18 days Pt/Family Agrees to Admission  and willing to participate: Yes Program Orientation Provided & Reviewed with Pt/Caregiver Including Roles  & Responsibilities: Yes  Decrease burden of Care through IP rehab admission: not anticipated  Possible need for SNF placement upon discharge: not anticipated  Patient Condition: I have reviewed medical records from [facility], spoken with CM, and patient and spouse. I met with patient at the bedside for inpatient rehabilitation assessment.  Patient will benefit from ongoing PT and OT, can actively participate in 3 hours of therapy a day 5 days of the week, and can make measurable gains during the admission.  Patient will also benefit from the coordinated team approach during an Inpatient Acute Rehabilitation admission.  The patient will receive intensive therapy as well as Rehabilitation physician, nursing, social worker, and care management interventions.  Due to safety, skin/wound care, disease management, medication administration, pain management, and patient education the patient requires 24 hour a day rehabilitation nursing.  The patient is currently *** with  mobility and basic ADLs.  Discharge setting and therapy post discharge at home with home health is anticipated.  Patient has agreed to participate in the Acute Inpatient Rehabilitation Program and will admit today.  Preadmission Screen Completed By:  Leita KATHEE Kleine, 01/22/2024 2:43 PM ______________________________________________________________________   Discussed status with Dr. PIERRETTE on *** at *** and received approval for admission today.  Admission Coordinator:  Leita KATHEE Kleine, CCC-SLP, time PIERRETTEPattricia ***   Assessment/Plan: Diagnosis: *** Does the need for close, 24 hr/day Medical supervision in concert with the patient's rehab needs make it unreasonable for this patient to be served in a less intensive setting? {yes_no_potentially:3041433} Co-Morbidities requiring supervision/potential complications: *** Due to {due un:6958565}, does the patient require 24 hr/day rehab nursing? {yes_no_potentially:3041433} Does the patient require coordinated care of a physician, rehab nurse, PT, OT, and SLP to address physical and functional deficits in the context of the above medical diagnosis(es)? {yes_no_potentially:3041433} Addressing deficits in the following areas: {deficits:3041436} Can the patient actively participate in an intensive therapy program of at least 3 hrs of therapy 5 days a week? {yes_no_potentially:3041433} The potential for patient to make measurable gains while on inpatient rehab is {potential:3041437} Anticipated functional outcomes upon discharge from inpatient rehab: {functional outcomes:304600100} PT, {functional outcomes:304600100} OT, {functional outcomes:304600100} SLP Estimated rehab length of stay to reach the above functional goals is: *** Anticipated discharge destination: {anticipated dc setting:21604} 10. Overall Rehab/Functional Prognosis: {potential:3041437}   MD Signature: ***

## 2024-01-22 NOTE — Plan of Care (Signed)

## 2024-01-22 NOTE — Consult Note (Signed)
 WOC team consulted due to NPWT reading blocked.  WOC team is not managing this NPWT and neurosurgery will need to be notified regarding any issues with Vac.   Secure chat sent to primary nurse regarding above.    Thank you,    Powell Bar MSN, RN-BC, [employer]

## 2024-01-22 NOTE — Progress Notes (Signed)
 Inpatient Rehab Admissions Coordinator:    I met with Pt. To discuss potential CIR admit. He is interested and states that his wife can do light min A and be home with him at d/c. I will send case to insurance and pursue for admit.   Leita Kleine, MS, CCC-SLP Rehab Admissions Coordinator  [phone] (celll) [phone] (office)

## 2024-01-23 ENCOUNTER — Other Ambulatory Visit: Payer: Self-pay

## 2024-01-23 ENCOUNTER — Inpatient Hospital Stay (HOSPITAL_COMMUNITY)

## 2024-01-23 DIAGNOSIS — B9561 Methicillin susceptible Staphylococcus aureus infection as the cause of diseases classified elsewhere: Secondary | ICD-10-CM | POA: Diagnosis not present

## 2024-01-23 DIAGNOSIS — B965 Pseudomonas (aeruginosa) (mallei) (pseudomallei) as the cause of diseases classified elsewhere: Secondary | ICD-10-CM | POA: Diagnosis not present

## 2024-01-23 DIAGNOSIS — T8149XA Infection following a procedure, other surgical site, initial encounter: Secondary | ICD-10-CM | POA: Diagnosis not present

## 2024-01-23 LAB — SEDIMENTATION RATE: Sed Rate: 48 mm/h — ABNORMAL HIGH (ref 0–16)

## 2024-01-23 LAB — C-REACTIVE PROTEIN: CRP: 18.8 mg/dL — ABNORMAL HIGH (ref <1.0)

## 2024-01-23 MED ORDER — SODIUM CHLORIDE 0.9% FLUSH: 10.0000 mL | INTRAVENOUS | Status: DC | PRN

## 2024-01-23 MED ORDER — HEPARIN SODIUM (PORCINE) 5000 UNIT/ML IJ SOLN: 5000.0000 [IU] | Freq: Three times a day (TID) | INTRAMUSCULAR | Status: DC

## 2024-01-23 MED ORDER — HEPARIN SODIUM (PORCINE) 5000 UNIT/ML IJ SOLN
5000.0000 [IU] | Freq: Three times a day (TID) | INTRAMUSCULAR | Status: DC
  Administered 2024-01-23 – 2024-01-26 (×9): 5000 [IU] via SUBCUTANEOUS
  Filled 2024-01-23 (×9): qty 1

## 2024-01-23 MED ORDER — SODIUM CHLORIDE 0.9% FLUSH
10.0000 mL | Freq: Two times a day (BID) | INTRAVENOUS | Status: DC
  Administered 2024-01-23 – 2024-01-26 (×4): 10 mL

## 2024-01-23 MED ORDER — CHLORHEXIDINE GLUCONATE CLOTH 2 % EX PADS
6.0000 | MEDICATED_PAD | Freq: Every day | CUTANEOUS | Status: DC
  Administered 2024-01-23 – 2024-01-26 (×4): 6 via TOPICAL

## 2024-01-23 NOTE — [place] FL2 (Signed)
 [place]  MEDICAID FL2 LEVEL OF CARE FORM     IDENTIFICATION  Patient Name: Frank Zuniga Birthdate: December 11, 1940 Sex: male Admission Date (Current Location): 01/20/2024  [facility] and [place] Number:  Producer, Television/film/video and Address:  [facility], [address], [place], [place] [address]      Provider Number: [id number]  Attending Physician Name and Address:  Mavis Purchase, MD  Relative Name and Phone Number:  Sebastin, Perlmutter   [phone]    Current Level of Care: Hospital Recommended Level of Care: Skilled Nursing Facility Prior Approval Number:    Date Approved/Denied:   PASRR Number:    Discharge Plan: SNF    Current Diagnoses: Patient Active Problem List   Diagnosis Date Noted   Erythema of wound 01/22/2024   Wound infection after surgery 01/20/2024   Spondylolisthesis of lumbar region 01/08/2024   Aortic stenosis moderate 10/24/2023   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, coronary CT angio 2025 10/24/2023   Heart murmur 09/19/2023   Spinal stenosis of lumbar region 09/18/2023   Foot-drop 09/18/2023   Degeneration of lumbar intervertebral disc 03/02/2021   Gastro-esophageal reflux disease with esophagitis 03/02/2021   Primary generalized (osteo)arthritis 03/02/2021   Hospital discharge follow-up 03/03/2020   Hypokalemia 03/03/2020   S/P lumbar laminectomy 02/17/2020   Facet hypertrophy of lumbar region 09/20/2019   Acute pain of right knee 12/16/2018   Primary osteoarthritis of right hip 12/16/2018   Actinic keratosis 06/26/2018   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   Environmental and seasonal allergies 06/26/2018   Lumbar back pain with radiculopathy affecting lower extremity 06/26/2018   Prostate cancer (HCC) 12/13/2016   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/13/2016   Essential hypertension 08/30/2016   Preop cardiovascular exam  08/30/2016   Conductive hearing loss in left ear 05/06/2015   Dysfunction of left eustachian tube 05/06/2015    Orientation RESPIRATION BLADDER Height & Weight     Self, Time, Situation, Place  Normal Incontinent Weight: 152 lb 1.9 oz (69 kg) Height:  5' 5 (165.1 cm)  BEHAVIORAL SYMPTOMS/MOOD NEUROLOGICAL BOWEL NUTRITION STATUS        Diet (see discharge summary)  AMBULATORY STATUS COMMUNICATION OF NEEDS Skin   Extensive Assist Verbally Surgical wounds, Other (Comment) (redness)                       Personal Care Assistance Level of Assistance  Bathing, Feeding, Dressing Bathing Assistance: Maximum assistance Feeding assistance: Independent Dressing Assistance: Maximum assistance     Functional Limitations Info  Sight, Hearing, Speech Sight Info: Adequate Hearing Info: Impaired Speech Info: Adequate    SPECIAL CARE FACTORS FREQUENCY  PT (By licensed PT), OT (By licensed OT)     PT Frequency: 5x week OT Frequency: 5x week            Contractures Contractures Info: Not present    Additional Factors Info  Code Status, Allergies Code Status Info: full Allergies Info: Prevnar 13 (Pneumococcal 13-val Conj Vacc)           Current Medications (01/23/2024):  This is the current hospital active medication list Current Facility-Administered Medications  Medication Dose Route Frequency Provider Last Rate Last Admin   0.9 % NaCl with KCl 20 mEq/ L  infusion   Intravenous Continuous Cabbell, Kyle, MD   Stopped at 01/21/24 1255   acetaminophen  (TYLENOL ) tablet 650 mg  650 mg Oral Q4H PRN  Cabbell, Kyle, MD   650 mg at 01/21/24 0144   Or   acetaminophen  (TYLENOL ) suppository 650 mg  650 mg Rectal Q4H PRN Gillie Duncans, MD       alum & mag hydroxide-simeth (MAALOX/MYLANTA) 200-200-20 MG/5ML suspension 30 mL  30 mL Oral Q6H PRN Gillie Duncans, MD       irbesartan (AVAPRO) tablet 300 mg  300 mg Oral Daily Cabbell, Kyle, MD   300 mg at 01/23/24 9077   And   amLODipine  (NORVASC) tablet 5 mg  5 mg Oral Daily Cabbell, Kyle, MD   5 mg at 01/23/24 9077   And   hydrochlorothiazide (HYDRODIURIL) tablet 12.5 mg  12.5 mg Oral Daily Cabbell, Kyle, MD   12.5 mg at 01/23/24 9077   aspirin  EC tablet 81 mg  81 mg Oral Daily Cabbell, Kyle, MD   81 mg at 01/23/24 9077   bisacodyl (DULCOLAX) EC tablet 5 mg  5 mg Oral Daily PRN Gillie Duncans, MD       calcium carbonate (OS-CAL - dosed in mg of elemental calcium) tablet 1,250 mg  1,250 mg Oral BID WC Cabbell, Kyle, MD   1,250 mg at 01/23/24 9077   ceFEPIme (MAXIPIME) 2 g in sodium chloride  0.9 % 100 mL IVPB  2 g Intravenous Q12H Amend, Caron G, RPH 200 mL/hr at 01/23/24 9360 2 g at 01/23/24 9360   cholecalciferol (VITAMIN D3) 25 MCG (1000 UNIT) tablet 1,000 Units  1,000 Units Oral BID Cabbell, Kyle, MD   1,000 Units at 01/23/24 9076   diazepam (VALIUM) tablet 2 mg  2 mg Oral Q6H PRN Gillie Duncans, MD   2 mg at 01/21/24 0510   folic acid (FOLVITE) tablet 1 mg  1 mg Oral Daily Cabbell, Kyle, MD   1 mg at 01/23/24 9077   heparin  injection 5,000 Units  5,000 Units Subcutaneous Q8H Bergman, Meghan D, NP       hydroxychloroquine (PLAQUENIL) tablet 200 mg  200 mg Oral BID Cabbell, Kyle, MD   200 mg at 01/23/24 9077   hydroxypropyl methylcellulose / hypromellose (ISOPTO TEARS / GONIOVISC) 2.5 % ophthalmic solution 1 drop  1 drop Both Eyes Daily PRN Cabbell, Kyle, MD       loratadine (CLARITIN) tablet 10 mg  10 mg Oral Daily Cabbell, Kyle, MD   10 mg at 01/23/24 9077   magnesium citrate solution 1 Bottle  1 Bottle Oral Once PRN Gillie Duncans, MD       menthol (CEPACOL) lozenge 3 mg  1 lozenge Oral PRN Cabbell, Kyle, MD       Or   phenol (CHLORASEPTIC) mouth spray 1 spray  1 spray Mouth/Throat PRN Cabbell, Kyle, MD       morphine (PF) 2 MG/ML injection 1 mg  1 mg Intravenous Q2H PRN Cabbell, Kyle, MD       multivitamin with minerals tablet 1 tablet  1 tablet Oral Daily Cabbell, Kyle, MD   1 tablet at 01/23/24 9077   niacin (VITAMIN B3)  tablet 500 mg  500 mg Oral BID WC Cabbell, Kyle, MD   500 mg at 01/23/24 9076   ondansetron  (ZOFRAN ) tablet 4 mg  4 mg Oral Q6H PRN Gillie Duncans, MD       Or   ondansetron  (ZOFRAN ) injection 4 mg  4 mg Intravenous Q6H PRN Gillie Duncans, MD       oxyCODONE (Oxy IR/ROXICODONE) immediate release tablet 10 mg  10 mg Oral Q3H PRN Gillie Duncans, MD  10 mg at 01/22/24 1700   oxyCODONE (Oxy IR/ROXICODONE) immediate release tablet 5 mg  5 mg Oral Q3H PRN Gillie Duncans, MD       pantoprazole (PROTONIX) EC tablet 80 mg  80 mg Oral Daily Cabbell, Kyle, MD   80 mg at 01/23/24 9077   polyethylene glycol (MIRALAX / GLYCOLAX) packet 17 g  17 g Oral Daily Gillie Duncans, MD   17 g at 01/23/24 9076   senna (SENOKOT) tablet 8.6 mg  1 tablet Oral BID Cabbell, Kyle, MD   8.6 mg at 01/23/24 9077   senna-docusate (Senokot-S) tablet 1 tablet  1 tablet Oral QHS PRN Gillie Duncans, MD       sodium chloride  flush (NS) 0.9 % injection 3 mL  3 mL Intravenous Q12H Gillie Duncans, MD   3 mL at 01/23/24 9076   sodium chloride  flush (NS) 0.9 % injection 3 mL  3 mL Intravenous PRN Cabbell, Kyle, MD       sulfaSALAzine (AZULFIDINE) tablet 1,000 mg  1,000 mg Oral BID Cabbell, Kyle, MD   1,000 mg at 01/23/24 0923   tamsulosin (FLOMAX) capsule 0.4 mg  0.4 mg Oral Daily Cabbell, Kyle, MD   0.4 mg at 01/23/24 9077   zolpidem (AMBIEN) tablet 5 mg  5 mg Oral QHS PRN Gillie Duncans, MD         Discharge Medications: Please see discharge summary for a list of discharge medications.  Relevant Imaging Results:  Relevant Lab Results:   Additional Information SSN: [id number]  Bridget Cordella Simmonds, LCSW

## 2024-01-23 NOTE — TOC Initial Note (Signed)
 Transition of Care [facility]) - Initial/Assessment Note    Patient Details  Name: Frank Zuniga MRN: [id number] Date of Birth: May 15, 1940  [facility]) CM/SW Contact:    Bridget Cordella Simmonds, LCSW Phone Number: 01/23/2024, 2:00 PM  Clinical Narrative:    CSW informed by CIR that family is now requesting SNF: [facility].  CSW spoke with pt, who confirms this.  Pt from home with wife, Frank Zuniga.  No current services. Permission given to speak with her and with son Frank Zuniga.     CSW confirmed with son Frank Zuniga and he is also requesting SNF at [facility].  Referral sent out in hub, CSW reached out to [facility] to review.               Expected Discharge Plan: Skilled Nursing Facility Barriers to Discharge: Continued Medical Work up, SNF Pending bed offer   Patient Goals and CMS Choice Patient states their goals for this hospitalization and ongoing recovery are:: walk by myself   Choice offered to / list presented to : Patient, Adult Children (pt and son Frank Zuniga requesting Librarian, Academic)      Expected Discharge Plan and Services In-house Referral: Clinical Social Work   Post Acute Care Choice: Skilled Nursing Facility Living arrangements for the past 2 months: Single Family Home                                      Prior Living Arrangements/Services Living arrangements for the past 2 months: Single Family Home Lives with:: Spouse Patient language and need for interpreter reviewed:: Yes        Need for Family Participation in Patient Care: Yes (Comment) Care giver support system in place?: Yes (comment) Current home services: Other (comment) (none) Criminal Activity/Legal Involvement Pertinent to Current Situation/Hospitalization: No - Comment as needed  Activities of Daily Living      Permission Sought/Granted Permission sought to share information with : Family Supports Permission granted to share information with : Yes, Verbal Permission  Granted  Share Information with NAME: son Frank Zuniga, wife Frank Zuniga  Permission granted to share info w AGENCY: SNF        Emotional Assessment Appearance:: Appears stated age Attitude/Demeanor/Rapport: Engaged Affect (typically observed): Appropriate Orientation: : Oriented to Self, Oriented to Place, Oriented to  Time, Oriented to Situation      Admission diagnosis:  Weakness [R53.1] Wound infection after surgery [T81.49XA] Patient Active Problem List   Diagnosis Date Noted   Erythema of wound 01/22/2024   Wound infection after surgery 01/20/2024   Spondylolisthesis of lumbar region 01/08/2024   Aortic stenosis moderate 10/24/2023   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, coronary CT angio 2025 10/24/2023   Heart murmur 09/19/2023   Spinal stenosis of lumbar region 09/18/2023   Foot-drop 09/18/2023   Degeneration of lumbar intervertebral disc 03/02/2021   Gastro-esophageal reflux disease with esophagitis 03/02/2021   Primary generalized (osteo)arthritis 03/02/2021   Hospital discharge follow-up 03/03/2020   Hypokalemia 03/03/2020   S/P lumbar laminectomy 02/17/2020   Facet hypertrophy of lumbar region 09/20/2019   Acute pain of right knee 12/16/2018   Primary osteoarthritis of right hip 12/16/2018   Actinic keratosis 06/26/2018   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   Environmental and seasonal allergies 06/26/2018   Lumbar back pain with radiculopathy affecting lower extremity 06/26/2018   Prostate cancer (HCC) 12/13/2016   Rheumatoid arthritis involving  multiple sites with positive rheumatoid factor (HCC) 12/13/2016   Essential hypertension 08/30/2016   Preop cardiovascular exam 08/30/2016   Conductive hearing loss in left ear 05/06/2015   Dysfunction of left eustachian tube 05/06/2015   PCP:  Silver Lamar LABOR, MD Pharmacy:   [facility], [place] - [address] [place] [address] Phone: [phone]  Fax: [phone]  [facility] [phone] - [place], [place] - [address] AT [facility] [address] [facility] [address] [place] [address] Phone: [phone] Fax: [phone]     Social Drivers of Health (SDOH) Social History: SDOH Screenings   Food Insecurity: No Food Insecurity (01/21/2024)  Housing: Low Risk  (01/21/2024)  Transportation Needs: No Transportation Needs (01/21/2024)  Utilities: Not At Risk (01/21/2024)  Social Connections: Moderately Integrated (01/21/2024)  Tobacco Use: Medium Risk (01/20/2024)   SDOH Interventions:     Readmission Risk Interventions     No data to display

## 2024-01-23 NOTE — Progress Notes (Signed)
 2D echo attempted, patient having sterile procedure. Will try later.

## 2024-01-23 NOTE — Progress Notes (Signed)
 [facility]  Date of Admission:  01/20/2024      Total days of antibiotics 3           ASSESSMENT: Frank Zuniga is a 83 y.o. male admitted with   Surgical Site Infection -  MSSA and Pseudomonas -  Will arrange for cefepime outpatient for him. He will need close follow up to ensure no dose adjustments are needed and he is not expereincing any side effects. Alternatively we could consider continuous zosyn pump but with him going home with wound vac I don't want to risk too many pieces of medical equipment attached to him.  Baseline ESR 48 and CRP 18.8 mg/dL  - Cefepime 2 gm IV for 6 weeks following I&D - Close follow up in clinic to ensure tolerating well w/o side effects arranged with ID pharmacy in 2 weeks and Dr. Luiz in 3 weeks   Vascular Access -  -PICC ordered -Home health orders to maintain PICC line care and education for patient described below   Discharge Planning / Coordination of Care -  -Outpatient antibiotics set -Discussed with Holley Herring, ID pharmacy and primary  -planning SNF at [facility] (tentative and pending plan)   Medication Monitoring -  -Safety labs ordered and detailed below to be followed in OPAT clinic   PLAN: -Cefepime 2 gm IV for 6 weeks following I&D -PICC ordered -Home health orders to maintain PICC line care and education for patient described below   OPAT ORDERS:  Diagnosis: Spine SSI   Culture Result: MSSA, PSA   Allergies  Allergen Reactions   Prevnar 13 [Pneumococcal 13-Val Conj Vacc] Rash    Fever, chills, Pain     Discharge antibiotics to be given via PICC line:  Cefepime 2 gm IV Q12 hrs    Duration: 6 weeks from 11/01   End Date: Dec 13  [facility] Care Per Protocol with Biopatch Use: Home health RN for IV administration and teaching, line care and labs.    Labs weekly while on IV antibiotics: _x_ CBC with differential _x_ BMP **TWICE WEEKLY ON CEFEPIME _x_ CRP _x_  ESR   _x_ Please pull PIC at completion of IV antibiotics __ Please leave PIC in place until doctor has seen patient or been notified  Fax weekly labs to [phone]  Clinic Follow Up Appt:    Principal Problem:   Wound infection after surgery    irbesartan  300 mg Oral Daily   And   amLODipine  5 mg Oral Daily   And   hydrochlorothiazide  12.5 mg Oral Daily   aspirin  EC  81 mg Oral Daily   calcium carbonate  1,250 mg Oral BID WC   cholecalciferol  1,000 Units Oral BID   folic acid  1 mg Oral Daily   heparin  injection (subcutaneous)  5,000 Units Subcutaneous Q8H   hydroxychloroquine  200 mg Oral BID   loratadine  10 mg Oral Daily   multivitamin with minerals  1 tablet Oral Daily   niacin  500 mg Oral BID WC   pantoprazole  80 mg Oral Daily   polyethylene glycol  17 g Oral Daily   senna  1 tablet Oral BID   sodium chloride  flush  3 mL Intravenous Q12H   sulfaSALAzine  1,000 mg Oral BID   tamsulosin  0.4 mg Oral Daily    SUBJECTIVE: Back is uncomfortable. Feeling a bit hot and cold moments. Red  on the face but no itching   Review of Systems: ROS  Allergies  Allergen Reactions   Prevnar 13 [Pneumococcal 13-Val Conj Vacc] Rash    Fever, chills, Pain    OBJECTIVE: Vitals:   01/22/24 0807 01/22/24 1510 01/22/24 1945 01/23/24 0812  BP: (!) 160/77 132/63 133/69 (!) 146/86  Pulse: 95 98 94 93  Resp: 18 18 16 20   Temp: 97.9 F (36.6 C) 98.2 F (36.8 C) 98 F (36.7 C) 97.7 F (36.5 C)  TempSrc:      SpO2: 97% 95% 96% 98%  Weight:      Height:       Body mass index is 25.31 kg/m.  Physical Exam  Lab Results Lab Results  Component Value Date   WBC 8.6 01/21/2024   HGB 10.2 (L) 01/21/2024   HCT 30.2 (L) 01/21/2024   MCV 99.0 01/21/2024   PLT 240 01/21/2024    Lab Results  Component Value Date   CREATININE 0.85 01/21/2024   BUN 16 01/20/2024   NA 133 (L) 01/20/2024   K 3.5 01/20/2024   CL 95 (L) 01/20/2024   CO2 22 01/20/2024   No results  found for: ALT, AST, GGT, ALKPHOS, BILITOT   Microbiology: Recent Results (from the past 240 hours)  Aerobic/Anaerobic Culture w Gram Stain (surgical/deep wound)     Status: None (Preliminary result)   Collection Time: 01/20/24  8:07 PM   Specimen: Wound  Result Value Ref Range Status   Specimen Description WOUND  Final   Special Requests NONE  Final   Gram Stain   Final    FEW WBC PRESENT, PREDOMINANTLY PMN FEW GRAM POSITIVE COCCI Performed at [facility], [address], [place], [place] [address]    Culture   Final    FEW STAPHYLOCOCCUS AUREUS RARE PSEUDOMONAS AERUGINOSA NO ANAEROBES ISOLATED; CULTURE IN PROGRESS FOR 5 DAYS    Report Status PENDING  Incomplete   Organism ID, Bacteria STAPHYLOCOCCUS AUREUS  Final   Organism ID, Bacteria PSEUDOMONAS AERUGINOSA  Final      Susceptibility   Pseudomonas aeruginosa - MIC*    MEROPENEM <=0.25 SENSITIVE Sensitive     CIPROFLOXACIN <=0.06 SENSITIVE Sensitive     IMIPENEM 2 SENSITIVE Sensitive     PIP/TAZO Value in next row Sensitive      <=4 SENSITIVEThis is a modified FDA-approved test that has been validated and its performance characteristics determined by the reporting laboratory.  This laboratory is certified under the Clinical Laboratory Improvement Amendments CLIA as qualified to perform high complexity clinical laboratory testing.    CEFEPIME Value in next row Sensitive      <=4 SENSITIVEThis is a modified FDA-approved test that has been validated and its performance characteristics determined by the reporting laboratory.  This laboratory is certified under the Clinical Laboratory Improvement Amendments CLIA as qualified to perform high complexity clinical laboratory testing.    CEFTAZIDIME/AVIBACTAM Value in next row Sensitive      <=4 SENSITIVEThis is a modified FDA-approved test that has been validated and its performance characteristics determined by the reporting laboratory.  This laboratory is certified under  the Clinical Laboratory Improvement Amendments CLIA as qualified to perform high complexity clinical laboratory testing.    CEFTOLOZANE/TAZOBACTAM Value in next row Sensitive      <=4 SENSITIVEThis is a modified FDA-approved test that has been validated and its performance characteristics determined by the reporting laboratory.  This laboratory is certified under the Clinical Laboratory Improvement Amendments CLIA as qualified  to perform high complexity clinical laboratory testing.    TOBRAMYCIN Value in next row Sensitive      <=4 SENSITIVEThis is a modified FDA-approved test that has been validated and its performance characteristics determined by the reporting laboratory.  This laboratory is certified under the Clinical Laboratory Improvement Amendments CLIA as qualified to perform high complexity clinical laboratory testing.    CEFTAZIDIME Value in next row Sensitive      <=4 SENSITIVEThis is a modified FDA-approved test that has been validated and its performance characteristics determined by the reporting laboratory.  This laboratory is certified under the Clinical Laboratory Improvement Amendments CLIA as qualified to perform high complexity clinical laboratory testing.    * RARE PSEUDOMONAS AERUGINOSA   Staphylococcus aureus - MIC*    CIPROFLOXACIN Value in next row Sensitive      <=4 SENSITIVEThis is a modified FDA-approved test that has been validated and its performance characteristics determined by the reporting laboratory.  This laboratory is certified under the Clinical Laboratory Improvement Amendments CLIA as qualified to perform high complexity clinical laboratory testing.    ERYTHROMYCIN Value in next row Sensitive      <=4 SENSITIVEThis is a modified FDA-approved test that has been validated and its performance characteristics determined by the reporting laboratory.  This laboratory is certified under the Clinical Laboratory Improvement Amendments CLIA as qualified to perform high  complexity clinical laboratory testing.    GENTAMICIN Value in next row Sensitive      <=4 SENSITIVEThis is a modified FDA-approved test that has been validated and its performance characteristics determined by the reporting laboratory.  This laboratory is certified under the Clinical Laboratory Improvement Amendments CLIA as qualified to perform high complexity clinical laboratory testing.    OXACILLIN Value in next row Sensitive      <=4 SENSITIVEThis is a modified FDA-approved test that has been validated and its performance characteristics determined by the reporting laboratory.  This laboratory is certified under the Clinical Laboratory Improvement Amendments CLIA as qualified to perform high complexity clinical laboratory testing.    TETRACYCLINE Value in next row Sensitive      <=4 SENSITIVEThis is a modified FDA-approved test that has been validated and its performance characteristics determined by the reporting laboratory.  This laboratory is certified under the Clinical Laboratory Improvement Amendments CLIA as qualified to perform high complexity clinical laboratory testing.    VANCOMYCIN Value in next row Sensitive      <=4 SENSITIVEThis is a modified FDA-approved test that has been validated and its performance characteristics determined by the reporting laboratory.  This laboratory is certified under the Clinical Laboratory Improvement Amendments CLIA as qualified to perform high complexity clinical laboratory testing.    TRIMETH/SULFA Value in next row Sensitive      <=4 SENSITIVEThis is a modified FDA-approved test that has been validated and its performance characteristics determined by the reporting laboratory.  This laboratory is certified under the Clinical Laboratory Improvement Amendments CLIA as qualified to perform high complexity clinical laboratory testing.    CLINDAMYCIN Value in next row Sensitive      <=4 SENSITIVEThis is a modified FDA-approved test that has been validated  and its performance characteristics determined by the reporting laboratory.  This laboratory is certified under the Clinical Laboratory Improvement Amendments CLIA as qualified to perform high complexity clinical laboratory testing.    RIFAMPIN Value in next row Sensitive      <=4 SENSITIVEThis is a modified FDA-approved test that has been validated and its performance  characteristics determined by the reporting laboratory.  This laboratory is certified under the Clinical Laboratory Improvement Amendments CLIA as qualified to perform high complexity clinical laboratory testing.    Inducible Clindamycin Value in next row Sensitive      <=4 SENSITIVEThis is a modified FDA-approved test that has been validated and its performance characteristics determined by the reporting laboratory.  This laboratory is certified under the Clinical Laboratory Improvement Amendments CLIA as qualified to perform high complexity clinical laboratory testing.    LINEZOLID Value in next row Sensitive      <=4 SENSITIVEThis is a modified FDA-approved test that has been validated and its performance characteristics determined by the reporting laboratory.  This laboratory is certified under the Clinical Laboratory Improvement Amendments CLIA as qualified to perform high complexity clinical laboratory testing.    * FEW STAPHYLOCOCCUS AUREUS  Culture, blood (Routine X 2) w Reflex to ID Panel     Status: None (Preliminary result)   Collection Time: 01/21/24  3:13 PM   Specimen: BLOOD  Result Value Ref Range Status   Specimen Description BLOOD RIGHT ANTECUBITAL  Final   Special Requests   Final    BOTTLES DRAWN AEROBIC AND ANAEROBIC Blood Culture results may not be optimal due to an inadequate volume of blood received in culture bottles   Culture   Final    NO GROWTH 2 DAYS Performed at [facility], [address], [place], [place] [address]    Report Status PENDING  Incomplete  Culture, blood (Routine X 2) w Reflex to  ID Panel     Status: None (Preliminary result)   Collection Time: 01/21/24  3:20 PM   Specimen: BLOOD  Result Value Ref Range Status   Specimen Description BLOOD LEFT ANTECUBITAL  Final   Special Requests   Final    BOTTLES DRAWN AEROBIC AND ANAEROBIC Blood Culture results may not be optimal due to an inadequate volume of blood received in culture bottles   Culture   Final    NO GROWTH 2 DAYS Performed at [facility], [address] [place], [place] [address]    Report Status PENDING  Incomplete     Corean Fireman, MSN, NP-C [facility]  [email or web address] Pager: [phone] Office: [phone] [facility] Main Line: [phone] *Secure Chat Communication Welcome  Total Encounter Time: 30 m

## 2024-01-23 NOTE — Progress Notes (Signed)
 Physical Therapy Treatment Patient Details Name: Frank Zuniga MRN: [id number] DOB: 1940-11-17 Today's Date: 01/23/2024   History of Present Illness Pt is an 83 y.o. male who presented 01/20/24 with progressive weakness and multiple falls. Pt is s/p L4-L5, L5-S1 PLIF 01/08/24. Pt found to have infected lumbar wound, s/p debridement 11/1. PMHx: AS, prostate CA, GERD, HTN    PT Comments  Pt received after wound care, having pain from that but did receive pain meds prior to PT session. Pt needed max A to come to EOB. Worked on STS with stedy and increasing standing tolerance. Pt unable to extend knees fully in standing and was this unable to wt shift or take steps. Was able to stand from flaps of stedy with mod A. Patient will benefit from continued inpatient follow up therapy, <3 hours/day. Pt making progress but slowly. PT will continue to follow.     If plan is discharge home, recommend the following: Assistance with cooking/housework;Assist for transportation;A lot of help with walking and/or transfers;A lot of help with bathing/dressing/bathroom   Can travel by private vehicle        Equipment Recommendations  BSC/3in1;Wheelchair (measurements PT);Wheelchair cushion (measurements PT) (pending progress)    Recommendations for Other Services       Precautions / Restrictions Precautions Precautions: Back;Fall Precaution Booklet Issued: No Recall of Precautions/Restrictions: Intact Precaution/Restrictions Comments: wound vac at back; reviewed precautions Required Braces or Orthoses: Spinal Brace Spinal Brace: Other (comment) Spinal Brace Comments: no brace needed orders currently, but did have lumbar corset prior admission Restrictions Weight Bearing Restrictions Per Provider Order: No     Mobility  Bed Mobility Overal bed mobility: Needs Assistance Bed Mobility: Rolling, Sidelying to Sit, Sit to Sidelying Rolling: Used rails, Min assist Sidelying to sit: Max assist     Sit to  sidelying: Max assist General bed mobility comments: pt able to roll to R with use of rail. Needed facilitation of LE's off EOB and elevation of trunk to sitting. Needed max A to LE's for return to SL then supine    Transfers Overall transfer level: Needs assistance Equipment used: Ambulation equipment used Transfers: Sit to/from Stand Sit to Stand: Max assist, From elevated surface, Via lift equipment           General transfer comment: stedy used for additional support and to work on strengthening with STS. Max A needed to stand from bed, mod A from flaps of stedy. Tolerated 4 reps. Assist needed to control descent to sitting back on bed. IV team arrived to place PICC so no transfer to chair Transfer via Lift Equipment: Stedy  Ambulation/Gait             Pre-gait activities: worked on increasing standing tolerance General Gait Details: pt unable to wt shift or step feet in standing and B knees remained slightly flexed   Stairs             Wheelchair Mobility     Tilt Bed    Modified Rankin (Stroke Patients Only)       Balance Overall balance assessment: Needs assistance Sitting-balance support: Single extremity supported, Feet supported Sitting balance-Leahy Scale: Fair Sitting balance - Comments: posterior lean at times Postural control: Posterior lean Standing balance support: Bilateral upper extremity supported, During functional activity, Reliant on assistive device for balance Standing balance-Leahy Scale: Poor Standing balance comment: reliant on RW and external physical assist.  Communication Communication Communication: Impaired Factors Affecting Communication: Hearing impaired  Cognition Arousal: Alert Behavior During Therapy: WFL for tasks assessed/performed   PT - Cognitive impairments: No apparent impairments                       PT - Cognition Comments: in tact but slowed response time,  likely from hearing loss Following commands: Intact      Cueing Cueing Techniques: Verbal cues, Tactile cues  Exercises Other Exercises Other Exercises: bridging knees followed by pelvic tilt x5 for abdominal activation before getting up    General Comments General comments (skin integrity, edema, etc.): VSS on RA      Pertinent Vitals/Pain Pain Assessment Pain Assessment: Faces Faces Pain Scale: Hurts even more Pain Location: back Pain Descriptors / Indicators: Discomfort, Grimacing, Guarding, Sharp Pain Intervention(s): Limited activity within patient's tolerance, Monitored during session, Premedicated before session    Home Living                          Prior Function            PT Goals (current goals can now be found in the care plan section) Acute Rehab PT Goals Patient Stated Goal: to improve PT Goal Formulation: With patient Time For Goal Achievement: 02/04/24 Potential to Achieve Goals: Good Progress towards PT goals: Progressing toward goals    Frequency    Min 4X/week      PT Plan      Co-evaluation              AM-PAC PT 6 Clicks Mobility   Outcome Measure  Help needed turning from your back to your side while in a flat bed without using bedrails?: A Little Help needed moving from lying on your back to sitting on the side of a flat bed without using bedrails?: A Lot Help needed moving to and from a bed to a chair (including a wheelchair)?: Total Help needed standing up from a chair using your arms (e.g., wheelchair or bedside chair)?: Total Help needed to walk in hospital room?: Total (<20 ft) Help needed climbing 3-5 steps with a railing? : Total 6 Click Score: 9    End of Session Equipment Utilized During Treatment: Gait belt Activity Tolerance: Patient limited by pain;Patient limited by fatigue Patient left: with call bell/phone within reach;in bed;with bed alarm set;with nursing/sitter in room Nurse Communication:  Mobility status;Need for lift equipment (+2 or use stedy) PT Visit Diagnosis: Unsteadiness on feet (R26.81);Other abnormalities of gait and mobility (R26.89);Muscle weakness (generalized) (M62.81);Difficulty in walking, not elsewhere classified (R26.2);History of falling (Z91.81);Pain Pain - Right/Left:  (back and legs) Pain - part of body: Leg (back)     Time: 8499-8478 PT Time Calculation (min) (ACUTE ONLY): 21 min  Charges:    $Therapeutic Activity: 8-22 mins PT General Charges $$ ACUTE PT VISIT: 1 Visit                     Richerd Lipoma, PT  Acute Rehab Services Secure chat preferred Office [phone]    Richerd CROME Jakayla Schweppe 01/23/2024, 3:47 PM

## 2024-01-23 NOTE — Progress Notes (Signed)
 Subjective: The patient is alert and pleasant.  He is in no apparent distress.  He has no complaints.  Objective: Vital signs in last 24 hours: Temp:  [97.9 F (36.6 C)-98.2 F (36.8 C)] 98 F (36.7 C) (11/03 1945) Pulse Rate:  [94-98] 94 (11/03 1945) Resp:  [16-18] 16 (11/03 1945) BP: (132-160)/(63-77) 133/69 (11/03 1945) SpO2:  [95 %-97 %] 96 % (11/03 1945) Estimated body mass index is 25.31 kg/m as calculated from the following:   Height as of this encounter: 5' 5 (1.651 m).   Weight as of this encounter: 69 kg.   Intake/Output from previous day: 11/03 0701 - 11/04 0700 In: 580 [P.O.:480; IV Piggyback:100] Out: 1740 [Urine:1300; Drains:440] Intake/Output this shift: No intake/output data recorded.  Physical exam the patient is alert and oriented.  He is moving his lower extremities well.  His wound VAC is in place.  Apparently it was beeping last night.  Lab Results: Recent Labs    01/20/24 1620 01/21/24 0416  WBC 13.3* 8.6  HGB 12.8* 10.2*  HCT 38.5* 30.2*  PLT 288 240   BMET Recent Labs    01/20/24 1620 01/21/24 0416  NA 133*  --   K 3.5  --   CL 95*  --   CO2 22  --   GLUCOSE 111*  --   BUN 16  --   CREATININE 0.89 0.85  CALCIUM 9.4  --     Studies/Results: US  EKG SITE RITE Result Date: 01/22/2024 If Site Rite image not attached, placement could not be confirmed due to current cardiac rhythm.   Assessment/Plan: Postop day #2: His cultures have grown and sensitive Staphylococcus aureus as well as Pseudomonas.  I will await IDs recommendations for the biotics, length of treatment and when to insert the PICC line.  LOS: 3 days     [name] 01/23/2024, 7:41 AM     Patient ID: Frank Zuniga, male   DOB: Aug 12, 1940, 83 y.o.   MRN: [id number]

## 2024-01-23 NOTE — Consult Note (Signed)
 WOC Nurse Consult Note: Reason for Consult: Open lumbar wound; WOC to manage VAC therapy Wound type: surgical wound Measurement: 7 x 4 x 2 cm, undermining 4 cm circum, varying depth distal end Wound bed: pink, pale tissue, yellow slough, palpated bone  Drainage light pink Periwound: erythema; moisture skin breakdown Dressing procedure/placement/frequency: VAC therapy per provider twice a week  Removed old NPWT dressing; carefully removed two foams well embedded into spine cavity, images taken, no other black foam noted in cavity Cleansed wound with normal saline x 4 and sterile gauze Periwound skin protected with skin barrier wipe or window framed with original drape Skin protected to the flank with VAC drape for foam bridge, to prevent trac pad from being directly over upper back. Patient stated the dressing felt more comfortable once completed. Filled wound with  __01 piece of Mepitel, ____0 piece of white foam, ___02_ piece of black foam, used off-set black foam and medallion piece Sealed NPWT dressing at Hg  Patient received PO pain medication per bedside nurse prior to dressing change Patient tolerated procedure well and was repositioned for lunch, will secure chat with primary nurse to order more supplies for next dressing change.  WOC nurse will continue to provide NPWT dressing changed due to the complexity of the dressing change.    11/04 lumbar surgical wound  Lumbar surgical detail   If an alarm condition cannot be resolved after attempting resolutions listed in the troubleshooting guide, then contact [facility] at [phone]. Promptly notify the treating physician to obtain orders for their desired alternative dressing if the negative-therapy unit is going to be turned off or has been malfunctioning for 2 hours or more.  Please reconsult if wound worsens in condition and notify provider.   Sherrilyn Hals MSN RN CWOCN WOC [facility]  [phone] (Available from 7-3  pm Mon-Friday)

## 2024-01-23 NOTE — Progress Notes (Signed)
 Inpatient Rehab Admissions Coordinator:    Pt. Now wanting [facility]. CIR will sign off and withdraw case with HTA.  Leita Kleine, MS, CCC-SLP Rehab Admissions Coordinator  [phone] (celll) [phone] (office)

## 2024-01-23 NOTE — Progress Notes (Signed)
 Peripherally Inserted Central Catheter Placement  The IV Nurse has discussed with the patient and/or persons authorized to consent for the patient, the purpose of this procedure and the potential benefits and risks involved with this procedure.  The benefits include less needle sticks, lab draws from the catheter, and the patient may be discharged home with the catheter. Risks include, but not limited to, infection, bleeding, blood clot (thrombus formation), and puncture of an artery; nerve damage and irregular heartbeat and possibility to perform a PICC exchange if needed/ordered by physician.  Alternatives to this procedure were also discussed.  Bard Power PICC patient education guide, fact sheet on infection prevention and patient information card has been provided to patient /or left at bedside.    PICC Placement Documentation  PICC Single Lumen 01/23/24 Right Basilic 36 cm 0 cm (Active)  Indication for Insertion or Continuance of Line Prolonged intravenous therapies 01/23/24 1544  Exposed Catheter (cm) 0 cm 01/23/24 1544  Site Assessment Clean, Dry, Intact 01/23/24 1544  Line Status Flushed;Saline locked;Blood return noted 01/23/24 1544  Dressing Type Transparent;Securing device 01/23/24 1544  Dressing Status Antimicrobial disc/dressing in place;Clean, Dry, Intact 01/23/24 1544  Line Care Connections checked and tightened 01/23/24 1544  Dressing Intervention New dressing;Adhesive placed at insertion site (IV team only) 01/23/24 1544  Dressing Change Due 01/30/24 01/23/24 1544       Irais Mottram  Con 01/23/2024, 3:45 PM

## 2024-01-24 ENCOUNTER — Ambulatory Visit: Admitting: Cardiology

## 2024-01-24 ENCOUNTER — Inpatient Hospital Stay (HOSPITAL_COMMUNITY)

## 2024-01-24 DIAGNOSIS — T8149XA Infection following a procedure, other surgical site, initial encounter: Secondary | ICD-10-CM | POA: Diagnosis not present

## 2024-01-24 DIAGNOSIS — I38 Endocarditis, valve unspecified: Secondary | ICD-10-CM | POA: Diagnosis not present

## 2024-01-24 DIAGNOSIS — B9561 Methicillin susceptible Staphylococcus aureus infection as the cause of diseases classified elsewhere: Secondary | ICD-10-CM | POA: Diagnosis not present

## 2024-01-24 DIAGNOSIS — B965 Pseudomonas (aeruginosa) (mallei) (pseudomallei) as the cause of diseases classified elsewhere: Secondary | ICD-10-CM | POA: Diagnosis not present

## 2024-01-24 LAB — ECHOCARDIOGRAM COMPLETE
AR max vel: 1.67 cm2
AV Area VTI: 1.36 cm2
AV Area mean vel: 1.63 cm2
AV Mean grad: 13 mmHg
AV Peak grad: 22.8 mmHg
Ao pk vel: 2.39 m/s
Area-P 1/2: 3.77 cm2
Height: 65 in
S' Lateral: 3.2 cm
Single Plane A4C EF: 50.5 %
Weight: 2433.88 [oz_av]

## 2024-01-24 MED ORDER — CEFEPIME IV (FOR PTA / DISCHARGE USE ONLY): 2.0000 g | Freq: Two times a day (BID) | INTRAVENOUS | 0 refills | Status: DC

## 2024-01-24 NOTE — Progress Notes (Signed)
 Physical Therapy Treatment Patient Details Name: Frank Zuniga MRN: [id number] DOB: [birth year] Today's Date: 01/24/2024   History of Present Illness Pt is an 83 y.o. male who presented 01/20/24 with progressive weakness and multiple falls. Pt is s/p L4-L5, L5-S1 PLIF 01/08/24. Pt found to have infected lumbar wound, s/p debridement 11/1. PMHx: AS, prostate CA, GERD, HTN    PT Comments  Pt resting in bed on arrival, agreeable to session with slow progress towards acute goals due to LE weakness, poor balance, generalized weakness and pain. Pt requiring mod A +2 to complete bed and max A +2 to complete transfers sit<>stand and step pivot transfer EOB>recliner with pt needing heavy assist to maintain balance and step feet as well as reach sitting surface prior to sitting. Pt with knee buckling in standing this session needing max A to correct. Pt up in chair at end of session with all needs mets. Pt continues to benefit from skilled PT services to progress toward functional mobility goals.     If plan is discharge home, recommend the following: Assistance with cooking/housework;Assist for transportation;A lot of help with walking and/or transfers;A lot of help with bathing/dressing/bathroom   Can travel by private vehicle        Equipment Recommendations  BSC/3in1;Wheelchair (measurements PT);Wheelchair cushion (measurements PT) (pending progress)    Recommendations for Other Services       Precautions / Restrictions Precautions Precautions: Back;Fall Precaution Booklet Issued: No Recall of Precautions/Restrictions: Intact Precaution/Restrictions Comments: wound vac at back; reviewed precautions Required Braces or Orthoses: Spinal Brace Spinal Brace: Other (comment) Spinal Brace Comments: no brace needed orders currently, but did have lumbar corset prior admission Restrictions Weight Bearing Restrictions Per Provider Order: No     Mobility  Bed Mobility Overal bed mobility: Needs  Assistance Bed Mobility: Rolling, Sidelying to Sit, Sit to Sidelying Rolling: Used rails, Mod assist Sidelying to sit: Mod assist, +2 for physical assistance, HOB elevated, Used rails       General bed mobility comments: pt able to roll to R with use of rail. Needed facilitation of LE's off EOB and elevation of trunk to sitting with mod A +2    Transfers Overall transfer level: Needs assistance Equipment used: Rolling walker (2 wheels) Transfers: Sit to/from Stand Sit to Stand: Max assist, +2 physical assistance   Step pivot transfers: Max assist, +2 physical assistance       General transfer comment: max A +2 to stand from EOB and chair, dense cues for anterior weight shift as pt with tendency for posterior bias on rise, pt requiring max A to maintain standing with pt knees buckling x1 with pt sitting back down on EOB. max A +2 to step pivot to chair    Ambulation/Gait               General Gait Details: unable this session   Stairs             Wheelchair Mobility     Tilt Bed    Modified Rankin (Stroke Patients Only)       Balance Overall balance assessment: Needs assistance Sitting-balance support: Single extremity supported, Feet supported Sitting balance-Leahy Scale: Fair Sitting balance - Comments: posterior lean at times Postural control: Posterior lean Standing balance support: Bilateral upper extremity supported, During functional activity, Reliant on assistive device for balance Standing balance-Leahy Scale: Poor Standing balance comment: reliant on RW and external physical assist.  Communication Communication Communication: Impaired Factors Affecting Communication: Hearing impaired  Cognition Arousal: Alert Behavior During Therapy: WFL for tasks assessed/performed   PT - Cognitive impairments: No apparent impairments                       PT - Cognition Comments: in tact but slowed  response time, likely from hearing loss Following commands: Intact      Cueing Cueing Techniques: Verbal cues, Tactile cues  Exercises      General Comments General comments (skin integrity, edema, etc.): VSS on RA, pt wife and family memeber arriving during session      Pertinent Vitals/Pain Pain Assessment Pain Assessment: Faces Faces Pain Scale: Hurts even more Pain Location: back Pain Descriptors / Indicators: Discomfort, Grimacing, Guarding, Sharp Pain Intervention(s): Monitored during session, Limited activity within patient's tolerance, Repositioned    Home Living                          Prior Function            PT Goals (current goals can now be found in the care plan section) Acute Rehab PT Goals Patient Stated Goal: to improve PT Goal Formulation: With patient Time For Goal Achievement: 02/04/24 Progress towards PT goals: Not progressing toward goals - comment (weakness)    Frequency    Min 4X/week      PT Plan      Co-evaluation              AM-PAC PT 6 Clicks Mobility   Outcome Measure  Help needed turning from your back to your side while in a flat bed without using bedrails?: A Little Help needed moving from lying on your back to sitting on the side of a flat bed without using bedrails?: A Lot Help needed moving to and from a bed to a chair (including a wheelchair)?: Total Help needed standing up from a chair using your arms (e.g., wheelchair or bedside chair)?: Total Help needed to walk in hospital room?: Total (<20 ft) Help needed climbing 3-5 steps with a railing? : Total 6 Click Score: 9    End of Session   Activity Tolerance: Patient limited by fatigue;Patient tolerated treatment well Patient left: with call bell/phone within reach;with nursing/sitter in room;in chair;with family/visitor present Nurse Communication: Mobility status;Need for lift equipment (+2 or use stedy, wound vac alaraming with seal not intact) PT  Visit Diagnosis: Unsteadiness on feet (R26.81);Other abnormalities of gait and mobility (R26.89);Muscle weakness (generalized) (M62.81);Difficulty in walking, not elsewhere classified (R26.2);History of falling (Z91.81);Pain Pain - Right/Left:  (back and legs) Pain - part of body: Leg (back)     Time: 8595-8568 PT Time Calculation (min) (ACUTE ONLY): 27 min  Charges:    $Therapeutic Activity: 23-37 mins PT General Charges $$ ACUTE PT VISIT: 1 Visit                     Nanette Wirsing R. PTA Acute Rehabilitation Services Office: [phone]   Therisa CHRISTELLA Boor 01/24/2024, 2:40 PM

## 2024-01-24 NOTE — TOC Progression Note (Addendum)
 Transition of Care [facility]) - Progression Note    Patient Details  Name: Frank Zuniga MRN: [id number] Date of Birth: 11/30/1940  [facility] CM/SW Contact  Bridget Cordella Simmonds, LCSW Phone Number: 01/24/2024, 10:11 AM  Clinical Narrative:   PASSR: [phone] A.  1330: [facility]: she will know available beds later today.  1630: [facility]: no bed available until Saturday. CSW spoke with son [name], he is requesting response from [facility] in [place].  CSW reached out to [facility] and also to [facility] to review.  HTA auth request made to Tammy with facility choice pending.   Expected Discharge Plan: Skilled Nursing Facility Barriers to Discharge: Continued Medical Work up, SNF Pending bed offer               Expected Discharge Plan and Services In-house Referral: Clinical Social Work   Post Acute Care Choice: Skilled Nursing Facility Living arrangements for the past 2 months: Single Family Home                                       Social Drivers of Health (SDOH) Interventions SDOH Screenings   Food Insecurity: No Food Insecurity (01/21/2024)  Housing: Low Risk  (01/21/2024)  Transportation Needs: No Transportation Needs (01/21/2024)  Utilities: Not At Risk (01/21/2024)  Social Connections: Moderately Integrated (01/21/2024)  Tobacco Use: Medium Risk (01/20/2024)    Readmission Risk Interventions     No data to display

## 2024-01-24 NOTE — Progress Notes (Signed)
  Echocardiogram 2D Echocardiogram has been performed.  Koleen KANDICE Popper, RDCS 01/24/2024, 1:12 PM

## 2024-01-24 NOTE — Progress Notes (Signed)
 Occupational Therapy Treatment Patient Details Name: Frank Zuniga MRN: [id number] DOB: 09-17-1940 Today's Date: 01/24/2024   History of present illness Pt is an 83 y.o. male who presented 01/20/24 with progressive weakness and multiple falls. Pt is s/p L4-L5, L5-S1 PLIF 01/08/24. Pt found to have infected lumbar wound, s/p debridement 11/1. PMHx: AS, prostate CA, GERD, HTN   OT comments  Pt goals updated this session, downgraded based on Pt current level of function. Focus of session on progressing functional mobility to facilitate engagement in ADL tasks OOB. Pt required Max A +2 sit to stand transfer with stedy and total A +2 for bed mobility. Pt continues to require up to Max A for ADL engagement. Pt limited this session by pain and general weakness. OT to continue per updated POC. Patient will benefit from continued inpatient follow up therapy, <3 hours/day.       If plan is discharge home, recommend the following:  Two people to help with walking and/or transfers;A lot of help with bathing/dressing/bathroom;Assistance with cooking/housework;Help with stairs or ramp for entrance;Assist for transportation   Equipment Recommendations  BSC/3in1;Wheelchair (measurements OT);Wheelchair cushion (measurements OT)    Recommendations for Other Services      Precautions / Restrictions Precautions Precautions: Back;Fall Precaution Booklet Issued: No Recall of Precautions/Restrictions: Intact Precaution/Restrictions Comments: wound vac at back; reviewed precautions Required Braces or Orthoses: Spinal Brace Spinal Brace Comments: no brace needed orders currently, but did have lumbar corset prior admission Restrictions Weight Bearing Restrictions Per Provider Order: No       Mobility Bed Mobility Overal bed mobility: Needs Assistance Bed Mobility: Sit to Supine       Sit to supine: Total assist, +2 for physical assistance   General bed mobility comments: Pt greeted in recliner, returned  to bed with stedy. Pt required total A +2 for sit to supine and repostioning in bed    Transfers Overall transfer level: Needs assistance Equipment used: Ambulation equipment used Transfers: Sit to/from Stand Sit to Stand: Max assist, +2 physical assistance           General transfer comment: Pt Max A +2 sit to stand with dense verbal cues for sequencing steps to utilize stedy. Dense cues for anterior weight shift as Pt with posterior lean on rise and in standing. Pt required Max A to maintain standing balance in stedy for up to 45 seconds prior to bilateral knees bucking. Pt required two attempts on second sit to stand transfer, increased support required for power up. No control on descent to bed. Transfer via Lift Equipment: Stedy   Balance Overall balance assessment: Needs assistance Sitting-balance support: Bilateral upper extremity supported, Feet supported Sitting balance-Leahy Scale: Fair Sitting balance - Comments: posterior lean Postural control: Posterior lean Standing balance support: Bilateral upper extremity supported, During functional activity, Reliant on assistive device for balance Standing balance-Leahy Scale: Zero Standing balance comment: Reliant on ambulatory device and external support this session.                           ADL either performed or assessed with clinical judgement   ADL Overall ADL's : Needs assistance/impaired                         Toilet Transfer: Maximal assistance;+2 for physical assistance   Toileting- Clothing Manipulation and Hygiene: Total assistance              Extremity/Trunk Assessment  Upper Extremity Assessment Upper Extremity Assessment: Generalized weakness            Vision       Perception     Praxis     Communication Communication Communication: Impaired Factors Affecting Communication: Hearing impaired   Cognition Arousal: Alert Behavior During Therapy: WFL for tasks  assessed/performed Cognition: No apparent impairments                               Following commands: Intact        Cueing   Cueing Techniques: Verbal cues, Gestural cues  Exercises      Shoulder Instructions       General Comments VSS on RA. Pt noted with increased fatigue and pain following sit to stand, labored breathing requiring guidance on deep breathing strategies.    Pertinent Vitals/ Pain       Pain Assessment Pain Assessment: PAINAD Breathing: occasional labored breathing, short period of hyperventilation Negative Vocalization: occasional moan/groan, low speech, negative/disapproving quality Facial Expression: smiling or inexpressive Body Language: tense, distressed pacing, fidgeting Consolability: distracted or reassured by voice/touch PAINAD Score: 4 Pain Location: back Pain Descriptors / Indicators: Discomfort, Grimacing, Guarding, Moaning Pain Intervention(s): Limited activity within patient's tolerance, Monitored during session, Repositioned  Home Living                                          Prior Functioning/Environment              Frequency  Min 2X/week        Progress Toward Goals  OT Goals(current goals can now be found in the care plan section)  Progress towards OT goals: Goals drowngraded-see care plan  Acute Rehab OT Goals Patient Stated Goal: to get to bed OT Goal Formulation: With patient Time For Goal Achievement: 02/05/24 Potential to Achieve Goals: Good ADL Goals Pt Will Perform Upper Body Bathing: with min assist;with adaptive equipment;sitting Pt Will Perform Lower Body Bathing: with mod assist;with adaptive equipment;sitting/lateral leans Pt Will Perform Lower Body Dressing: with mod assist;with adaptive equipment;sitting/lateral leans Pt Will Transfer to Toilet: with mod assist;stand pivot transfer;bedside commode Pt Will Perform Toileting - Clothing Manipulation and hygiene: with mod  assist;sitting/lateral leans Additional ADL Goal #1: Pt will engage in bed mobility with Mod A as a precursor to ADLs.  Plan      Co-evaluation                 AM-PAC OT 6 Clicks Daily Activity     Outcome Measure   Help from another person eating meals?: None Help from another person taking care of personal grooming?: A Little Help from another person toileting, which includes using toliet, bedpan, or urinal?: Total Help from another person bathing (including washing, rinsing, drying)?: A Lot Help from another person to put on and taking off regular upper body clothing?: A Little Help from another person to put on and taking off regular lower body clothing?: A Lot 6 Click Score: 15    End of Session Equipment Utilized During Treatment: Gait belt;Other (comment) (stedy)  OT Visit Diagnosis: Unsteadiness on feet (R26.81);Muscle weakness (generalized) (M62.81);History of falling (Z91.81);Pain Pain - part of body:  (back)   Activity Tolerance Patient limited by pain   Patient Left in bed;with call bell/phone within reach;with bed alarm set  Nurse Communication Mobility status        Time: 8473-8456 OT Time Calculation (min): 17 min  Charges: OT General Charges $OT Visit: 1 Visit OT Treatments $Therapeutic Activity: 8-22 mins  Maurilio CROME, OTR/L.  [facility] Acute Rehabilitation  Office: [phone]   Maurilio PARAS Idrees Quam 01/24/2024, 4:27 PM

## 2024-01-24 NOTE — Progress Notes (Signed)
 PHARMACY CONSULT NOTE FOR:  OUTPATIENT  PARENTERAL ANTIBIOTIC THERAPY (OPAT)  Indication: Surgical site infection of spine Regimen: Cefepime 2 gm IV Q 12 hours End date: 03/02/24  IV antibiotic discharge orders are pended. To discharging provider:  please sign these orders via discharge navigator,  Select New Orders & click on the button choice - Manage This Unsigned Work.     Thank you for allowing pharmacy to be a part of this patient's care.  Damien Quiet, PharmD, BCPS, BCIDP Infectious Diseases Clinical Pharmacist Phone: [phone] 01/24/2024, 8:40 AM

## 2024-01-24 NOTE — Plan of Care (Signed)
  Problem: Activity: Goal: Ability to avoid complications of mobility impairment will improve Outcome: Progressing   Problem: Activity: Goal: Ability to tolerate increased activity will improve Outcome: Progressing   

## 2024-01-24 NOTE — Progress Notes (Signed)
 Subjective: The patient is alert and pleasant.  He complains his breakfast is bitter.  Objective: Vital signs in last 24 hours: Temp:  [95.8 F (35.4 C)-98 F (36.7 C)] 95.8 F (35.4 C) (11/05 0742) Pulse Rate:  [94-108] 99 (11/05 0742) Resp:  [16-20] 18 (11/05 0742) BP: (106-143)/(50-86) 143/86 (11/05 0742) SpO2:  [95 %-97 %] 95 % (11/05 0742) Estimated body mass index is 25.31 kg/m as calculated from the following:   Height as of this encounter: 5' 5 (1.651 m).   Weight as of this encounter: 69 kg.   Intake/Output from previous day: 11/04 0701 - 11/05 0700 In: 500 [P.O.:300; IV Piggyback:200] Out: 1100 [Urine:600; Drains:500] Intake/Output this shift: No intake/output data recorded.  Physical exam the patient is alert and oriented.  His lower extremity strength is normal.  Lab Results: No results for input(s): WBC, HGB, HCT, PLT in the last 72 hours. BMET No results for input(s): NA, K, CL, CO2, GLUCOSE, BUN, CREATININE, CALCIUM in the last 72 hours.  Studies/Results: US  EKG SITE RITE Result Date: 01/23/2024 If Site Rite image not attached, placement could not be confirmed due to current cardiac rhythm.  US  EKG SITE RITE Result Date: 01/22/2024 If Site Rite image not attached, placement could not be confirmed due to current cardiac rhythm.   Assessment/Plan: Wound infection: I appreciate IDs help.  He has a PICC line in place.  The plan is for 6 weeks of IV cefepime.  We are awaiting arrangements for home IV antibiotics.  LOS: 4 days     [name] 01/24/2024, 9:12 AM     Patient ID: Frank Zuniga, male   DOB: [birth year], 83 y.o.   MRN: [id number]

## 2024-01-24 NOTE — Progress Notes (Signed)
 [facility]  Date of Admission:  01/20/2024      Total days of antibiotics 4           ASSESSMENT: Frank Zuniga is a 83 y.o. male admitted with   Surgical Site Infection -  MSSA and Pseudomonas -  Will arrange for cefepime outpatient for him. He will need close follow up to ensure no dose adjustments are needed and he is not expereincing any side effects. Alternatively we could consider continuous zosyn pump but with him going home with wound vac I don't want to risk too many pieces of medical equipment attached to him.  Baseline ESR 48 and CRP 18.8 mg/dL  - Cefepime 2 gm IV for 6 weeks as planned  - no redness today and no new concerns.   Vascular Access -  -PICC ordered -Home health orders to maintain PICC line care and education for patient described below   Discharge Planning / Coordination of Care -  -Outpatient antibiotics set -planning SNF at [facility] (tentative and pending plan)   Medication Monitoring -  -Safety labs ordered and detailed below to be followed in OPAT clinic   ID will sign off - please call back with any questions/concerns or if we can be of further assistance.     PLAN: -Cefepime 2 gm IV for 6 weeks following I&D -PICC in place  -Home health orders to maintain PICC line care and education for patient described below    Principal Problem:   Wound infection after surgery    irbesartan  300 mg Oral Daily   And   amLODipine  5 mg Oral Daily   And   hydrochlorothiazide  12.5 mg Oral Daily   aspirin  EC  81 mg Oral Daily   calcium carbonate  1,250 mg Oral BID WC   Chlorhexidine  Gluconate Cloth  6 each Topical Daily   cholecalciferol  1,000 Units Oral BID   folic acid  1 mg Oral Daily   heparin  injection (subcutaneous)  5,000 Units Subcutaneous Q8H   hydroxychloroquine  200 mg Oral BID   loratadine  10 mg Oral Daily   multivitamin with minerals  1 tablet Oral Daily   niacin  500 mg Oral BID WC    pantoprazole  80 mg Oral Daily   polyethylene glycol  17 g Oral Daily   senna  1 tablet Oral BID   sodium chloride  flush  10-40 mL Intracatheter Q12H   sodium chloride  flush  3 mL Intravenous Q12H   sulfaSALAzine  1,000 mg Oral BID   tamsulosin  0.4 mg Oral Daily    SUBJECTIVE: Back is uncomfortable. Feeling a bit hot and cold moments. Red on the face but no itching   Review of Systems: Review of Systems  Constitutional:  Negative for chills and fever.  Genitourinary: Negative.   Musculoskeletal: Negative.   Skin:  Negative for itching and rash.    Allergies  Allergen Reactions   Prevnar 13 [Pneumococcal 13-Val Conj Vacc] Rash    Fever, chills, Pain    OBJECTIVE: Vitals:   01/23/24 1615 01/23/24 1938 01/24/24 0447 01/24/24 0742  BP: 113/62 (!) 106/50 (!) 141/74 (!) 143/86  Pulse: (!) 108  94 99  Resp: 20 17 16 18   Temp: 97.8 F (36.6 C) 98 F (36.7 C) (!) 97.5 F (36.4 C) (!) 95.8 F (35.4 C)  TempSrc:  Oral  Rectal  SpO2: 97% 95% 95% 95%  Weight:      Height:       Body mass index is 25.31 kg/m.  Physical Exam Constitutional:      Appearance: Normal appearance. He is not ill-appearing.  HENT:     Head: Normocephalic.     Mouth/Throat:     Mouth: Mucous membranes are moist.     Pharynx: Oropharynx is clear.  Eyes:     General: No scleral icterus. Cardiovascular:     Rate and Rhythm: Normal rate and regular rhythm.  Pulmonary:     Effort: Pulmonary effort is normal.  Musculoskeletal:        General: Normal range of motion.     Cervical back: Normal range of motion.  Skin:    Coloration: Skin is not jaundiced or pale.  Neurological:     Mental Status: He is alert and oriented to person, place, and time.  Psychiatric:        Mood and Affect: Mood normal.        Judgment: Judgment normal.     Lab Results Lab Results  Component Value Date   WBC 8.6 01/21/2024   HGB 10.2 (L) 01/21/2024   HCT 30.2 (L) 01/21/2024   MCV 99.0 01/21/2024   PLT 240  01/21/2024    Lab Results  Component Value Date   CREATININE 0.85 01/21/2024   BUN 16 01/20/2024   NA 133 (L) 01/20/2024   K 3.5 01/20/2024   CL 95 (L) 01/20/2024   CO2 22 01/20/2024   No results found for: ALT, AST, GGT, ALKPHOS, BILITOT   Microbiology: Recent Results (from the past 240 hours)  Aerobic/Anaerobic Culture w Gram Stain (surgical/deep wound)     Status: None (Preliminary result)   Collection Time: 01/20/24  8:07 PM   Specimen: Wound  Result Value Ref Range Status   Specimen Description WOUND  Final   Special Requests NONE  Final   Gram Stain   Final    FEW WBC PRESENT, PREDOMINANTLY PMN FEW GRAM POSITIVE COCCI Performed at [facility], [address], [place], [place] [address]    Culture   Final    FEW STAPHYLOCOCCUS AUREUS RARE PSEUDOMONAS AERUGINOSA NO ANAEROBES ISOLATED; CULTURE IN PROGRESS FOR 5 DAYS    Report Status PENDING  Incomplete   Organism ID, Bacteria STAPHYLOCOCCUS AUREUS  Final   Organism ID, Bacteria PSEUDOMONAS AERUGINOSA  Final      Susceptibility   Pseudomonas aeruginosa - MIC*    MEROPENEM <=0.25 SENSITIVE Sensitive     CIPROFLOXACIN <=0.06 SENSITIVE Sensitive     IMIPENEM 2 SENSITIVE Sensitive     PIP/TAZO Value in next row Sensitive      <=4 SENSITIVEThis is a modified FDA-approved test that has been validated and its performance characteristics determined by the reporting laboratory.  This laboratory is certified under the Clinical Laboratory Improvement Amendments CLIA as qualified to perform high complexity clinical laboratory testing.    CEFEPIME Value in next row Sensitive      <=4 SENSITIVEThis is a modified FDA-approved test that has been validated and its performance characteristics determined by the reporting laboratory.  This laboratory is certified under the Clinical Laboratory Improvement Amendments CLIA as qualified to perform high complexity clinical laboratory testing.    CEFTAZIDIME/AVIBACTAM Value in next  row Sensitive      <=4 SENSITIVEThis is a modified FDA-approved test that has been validated and its performance characteristics determined by the reporting laboratory.  This laboratory is certified under the Clinical Laboratory  Improvement Amendments CLIA as qualified to perform high complexity clinical laboratory testing.    CEFTOLOZANE/TAZOBACTAM Value in next row Sensitive      <=4 SENSITIVEThis is a modified FDA-approved test that has been validated and its performance characteristics determined by the reporting laboratory.  This laboratory is certified under the Clinical Laboratory Improvement Amendments CLIA as qualified to perform high complexity clinical laboratory testing.    TOBRAMYCIN Value in next row Sensitive      <=4 SENSITIVEThis is a modified FDA-approved test that has been validated and its performance characteristics determined by the reporting laboratory.  This laboratory is certified under the Clinical Laboratory Improvement Amendments CLIA as qualified to perform high complexity clinical laboratory testing.    CEFTAZIDIME Value in next row Sensitive      <=4 SENSITIVEThis is a modified FDA-approved test that has been validated and its performance characteristics determined by the reporting laboratory.  This laboratory is certified under the Clinical Laboratory Improvement Amendments CLIA as qualified to perform high complexity clinical laboratory testing.    * RARE PSEUDOMONAS AERUGINOSA   Staphylococcus aureus - MIC*    CIPROFLOXACIN Value in next row Sensitive      <=4 SENSITIVEThis is a modified FDA-approved test that has been validated and its performance characteristics determined by the reporting laboratory.  This laboratory is certified under the Clinical Laboratory Improvement Amendments CLIA as qualified to perform high complexity clinical laboratory testing.    ERYTHROMYCIN Value in next row Sensitive      <=4 SENSITIVEThis is a modified FDA-approved test that has been  validated and its performance characteristics determined by the reporting laboratory.  This laboratory is certified under the Clinical Laboratory Improvement Amendments CLIA as qualified to perform high complexity clinical laboratory testing.    GENTAMICIN Value in next row Sensitive      <=4 SENSITIVEThis is a modified FDA-approved test that has been validated and its performance characteristics determined by the reporting laboratory.  This laboratory is certified under the Clinical Laboratory Improvement Amendments CLIA as qualified to perform high complexity clinical laboratory testing.    OXACILLIN Value in next row Sensitive      <=4 SENSITIVEThis is a modified FDA-approved test that has been validated and its performance characteristics determined by the reporting laboratory.  This laboratory is certified under the Clinical Laboratory Improvement Amendments CLIA as qualified to perform high complexity clinical laboratory testing.    TETRACYCLINE Value in next row Sensitive      <=4 SENSITIVEThis is a modified FDA-approved test that has been validated and its performance characteristics determined by the reporting laboratory.  This laboratory is certified under the Clinical Laboratory Improvement Amendments CLIA as qualified to perform high complexity clinical laboratory testing.    VANCOMYCIN Value in next row Sensitive      <=4 SENSITIVEThis is a modified FDA-approved test that has been validated and its performance characteristics determined by the reporting laboratory.  This laboratory is certified under the Clinical Laboratory Improvement Amendments CLIA as qualified to perform high complexity clinical laboratory testing.    TRIMETH/SULFA Value in next row Sensitive      <=4 SENSITIVEThis is a modified FDA-approved test that has been validated and its performance characteristics determined by the reporting laboratory.  This laboratory is certified under the Clinical Laboratory Improvement  Amendments CLIA as qualified to perform high complexity clinical laboratory testing.    CLINDAMYCIN Value in next row Sensitive      <=4 SENSITIVEThis is a modified FDA-approved test that has  been validated and its performance characteristics determined by the reporting laboratory.  This laboratory is certified under the Clinical Laboratory Improvement Amendments CLIA as qualified to perform high complexity clinical laboratory testing.    RIFAMPIN Value in next row Sensitive      <=4 SENSITIVEThis is a modified FDA-approved test that has been validated and its performance characteristics determined by the reporting laboratory.  This laboratory is certified under the Clinical Laboratory Improvement Amendments CLIA as qualified to perform high complexity clinical laboratory testing.    Inducible Clindamycin Value in next row Sensitive      <=4 SENSITIVEThis is a modified FDA-approved test that has been validated and its performance characteristics determined by the reporting laboratory.  This laboratory is certified under the Clinical Laboratory Improvement Amendments CLIA as qualified to perform high complexity clinical laboratory testing.    LINEZOLID Value in next row Sensitive      <=4 SENSITIVEThis is a modified FDA-approved test that has been validated and its performance characteristics determined by the reporting laboratory.  This laboratory is certified under the Clinical Laboratory Improvement Amendments CLIA as qualified to perform high complexity clinical laboratory testing.    * FEW STAPHYLOCOCCUS AUREUS  Culture, blood (Routine X 2) w Reflex to ID Panel     Status: None (Preliminary result)   Collection Time: 01/21/24  3:13 PM   Specimen: BLOOD  Result Value Ref Range Status   Specimen Description BLOOD RIGHT ANTECUBITAL  Final   Special Requests   Final    BOTTLES DRAWN AEROBIC AND ANAEROBIC Blood Culture results may not be optimal due to an inadequate volume of blood received in culture  bottles   Culture   Final    NO GROWTH 3 DAYS Performed at [facility], [address], [place], [place] [address]    Report Status PENDING  Incomplete  Culture, blood (Routine X 2) w Reflex to ID Panel     Status: None (Preliminary result)   Collection Time: 01/21/24  3:20 PM   Specimen: BLOOD  Result Value Ref Range Status   Specimen Description BLOOD LEFT ANTECUBITAL  Final   Special Requests   Final    BOTTLES DRAWN AEROBIC AND ANAEROBIC Blood Culture results may not be optimal due to an inadequate volume of blood received in culture bottles   Culture   Final    NO GROWTH 3 DAYS Performed at [facility], [address] [place], [place] [address]    Report Status PENDING  Incomplete     Corean Fireman, MSN, NP-C [facility]  [email or web address] Pager: [phone] Office: [phone] [facility] Main Line: [phone] *Secure Chat Communication Welcome  Total Encounter Time: 30 m

## 2024-01-25 LAB — AEROBIC/ANAEROBIC CULTURE W GRAM STAIN (SURGICAL/DEEP WOUND)

## 2024-01-25 MED ORDER — OXYCODONE-ACETAMINOPHEN 5-325 MG PO TABS: 1.0000 | ORAL_TABLET | ORAL | 0 refills | Status: AC | PRN

## 2024-01-25 MED ORDER — MUPIROCIN 2 % EX OINT: 1.0000 | TOPICAL_OINTMENT | Freq: Two times a day (BID) | CUTANEOUS | 0 refills | Status: AC

## 2024-01-25 MED ORDER — CEFEPIME IV (FOR PTA / DISCHARGE USE ONLY): 2.0000 g | Freq: Two times a day (BID) | INTRAVENOUS | 0 refills | Status: AC

## 2024-01-25 MED ORDER — CHLORHEXIDINE GLUCONATE 4 % EX SOLN: 1.0000 | CUTANEOUS | 1 refills | Status: AC

## 2024-01-25 NOTE — Progress Notes (Signed)
 Providing Compassionate, Quality Care - Together   Subjective: Patient reports no new issues. It sounds like the patient and his family would like for him to discharge to a skilled nursing facility for antibiotic administration and further rehabilitation.  Objective: Vital signs in last 24 hours: Temp:  [97.9 F (36.6 C)-98.3 F (36.8 C)] 97.9 F (36.6 C) (11/06 0816) Pulse Rate:  [98-104] 104 (11/06 0816) Resp:  [16-17] 17 (11/06 0816) BP: (113-131)/(53-66) 131/66 (11/06 0816) SpO2:  [94 %-97 %] 94 % (11/06 0816)  Intake/Output from previous day: 11/05 0701 - 11/06 0700 In: 400 [IV Piggyback:400] Out: 650 [Urine:400; Drains:250] Intake/Output this shift: Total I/O In: -  Out: 1100 [Urine:1100]  Alert and oriented MAE, generalized weakness Wound VAC in place   Lab Results: No results for input(s): WBC, HGB, HCT, PLT in the last 72 hours. BMET No results for input(s): NA, K, CL, CO2, GLUCOSE, BUN, CREATININE, CALCIUM in the last 72 hours.  Studies/Results: ECHOCARDIOGRAM COMPLETE Result Date: 01/24/2024    ECHOCARDIOGRAM REPORT   Patient Name:   Frank Zuniga Date of Exam: 01/24/2024 Medical Rec #:  [id number]    Height:       65.0 in Accession #:    [id number]   Weight:       152.1 lb Date of Birth:  03-31-1940    BSA:          1.761 m Patient Age:    83 years     BP:           143/86 mmHg Patient Gender: M            HR:           99 bpm. Exam Location:  Inpatient Procedure: 2D Echo, Cardiac Doppler and Color Doppler (Both Spectral and Color            Flow Doppler were utilized during procedure). Indications:    Endocarditis  History:        Patient has prior history of Echocardiogram examinations, most                 recent 10/05/2023. Hx of cancer, Signs/Symptoms:Murmur and Chest                 Pain; Risk Factors:Former Smoker, Dyslipidemia and Hypertension.  Sonographer:    Koleen Popper RDCS Referring Phys: [phone] STEPHANIE N DIXON IMPRESSIONS  1.  Left ventricular ejection fraction, by estimation, is 50 to 55%. The left ventricle has low normal function. The left ventricle has no regional wall motion abnormalities. There is mild concentric left ventricular hypertrophy. Left ventricular diastolic parameters are consistent with Grade I diastolic dysfunction (impaired relaxation).  2. Right ventricular systolic function is normal. The right ventricular size is normal. There is normal pulmonary artery systolic pressure.  3. The mitral valve is normal in structure. No evidence of mitral valve regurgitation. No evidence of mitral stenosis.  4. The aortic valve is calcified. Aortic valve regurgitation is not visualized. Mild aortic valve stenosis. Aortic valve area, by VTI measures 1.36 cm. Aortic valve mean gradient measures 13.0 mmHg. Aortic valve Vmax measures 2.39 m/s.  5. The inferior vena cava is normal in size with greater than 50% respiratory variability, suggesting right atrial pressure of 3 mmHg. FINDINGS  Left Ventricle: Left ventricular ejection fraction, by estimation, is 50 to 55%. The left ventricle has low normal function. The left ventricle has no regional wall motion abnormalities. The left ventricular internal cavity size was  normal in size. There is mild concentric left ventricular hypertrophy. Left ventricular diastolic parameters are consistent with Grade I diastolic dysfunction (impaired relaxation). Normal left ventricular filling pressure. Right Ventricle: The right ventricular size is normal. No increase in right ventricular wall thickness. Right ventricular systolic function is normal. There is normal pulmonary artery systolic pressure. The tricuspid regurgitant velocity is 1.47 m/s, and  with an assumed right atrial pressure of 3 mmHg, the estimated right ventricular systolic pressure is 11.6 mmHg. Left Atrium: Left atrial size was normal in size. Right Atrium: Right atrial size was normal in size. Pericardium: There is no evidence of  pericardial effusion. Mitral Valve: The mitral valve is normal in structure. Mild mitral annular calcification. No evidence of mitral valve regurgitation. No evidence of mitral valve stenosis. Tricuspid Valve: The tricuspid valve is normal in structure. Tricuspid valve regurgitation is trivial. No evidence of tricuspid stenosis. Aortic Valve: The aortic valve is calcified. Aortic valve regurgitation is not visualized. Mild aortic stenosis is present. Aortic valve mean gradient measures 13.0 mmHg. Aortic valve peak gradient measures 22.8 mmHg. Aortic valve area, by VTI measures 1.36 cm. Pulmonic Valve: The pulmonic valve was normal in structure. Pulmonic valve regurgitation is not visualized. No evidence of pulmonic stenosis. Aorta: The aortic root is normal in size and structure. Venous: The inferior vena cava is normal in size with greater than 50% respiratory variability, suggesting right atrial pressure of 3 mmHg. IAS/Shunts: No atrial level shunt detected by color flow Doppler.  LEFT VENTRICLE PLAX 2D LVIDd:         4.00 cm      Diastology LVIDs:         3.20 cm      LV e' medial:    5.22 cm/s LV PW:         1.20 cm      LV E/e' medial:  9.6 LV IVS:        1.20 cm      LV e' lateral:   9.25 cm/s LVOT diam:     1.80 cm      LV E/e' lateral: 5.4 LV SV:         59 LV SV Index:   34 LVOT Area:     2.54 cm  LV Volumes (MOD) LV vol d, MOD A4C: 147.0 ml LV vol s, MOD A4C: 72.7 ml LV SV MOD A4C:     147.0 ml RIGHT VENTRICLE             IVC RV Basal diam:  4.00 cm     IVC diam: 2.00 cm RV S prime:     17.50 cm/s TAPSE (M-mode): 2.4 cm LEFT ATRIUM             Index        RIGHT ATRIUM           Index LA diam:        3.80 cm 2.16 cm/m   RA Area:     11.00 cm LA Vol (A2C):   27.5 ml 15.62 ml/m  RA Volume:   22.30 ml  12.66 ml/m LA Vol (A4C):   21.7 ml 12.32 ml/m LA Biplane Vol: 24.7 ml 14.03 ml/m  AORTIC VALVE AV Area (Vmax):    1.67 cm AV Area (Vmean):   1.63 cm AV Area (VTI):     1.36 cm AV Vmax:            238.60 cm/s AV Vmean:  167.000 cm/s AV VTI:            0.435 m AV Peak Grad:      22.8 mmHg AV Mean Grad:      13.0 mmHg LVOT Vmax:         157.00 cm/s LVOT Vmean:        107.000 cm/s LVOT VTI:          0.232 m LVOT/AV VTI ratio: 0.53  AORTA Ao Root diam: 3.00 cm Ao Asc diam:  3.20 cm MITRAL VALVE               TRICUSPID VALVE MV Area (PHT): 3.77 cm    TR Peak grad:   8.6 mmHg MV Decel Time: 201 msec    TR Vmax:        147.00 cm/s MV E velocity: 50.30 cm/s MV A velocity: 96.40 cm/s  SHUNTS MV E/A ratio:  0.52        Systemic VTI:  0.23 m                            Systemic Diam: 1.80 cm Annabella Scarce MD Electronically signed by Annabella Scarce MD Signature Date/Time: 01/24/2024/4:40:49 PM    Final     Assessment/Plan: Patient with a postoperative wound infection. PICC line in place and antibiotics ordered for discharge. Planning on discharge to SNF.   LOS: 5 days    I am in communication with my attending and they agree with the plan for this patient.   Gerard Beck, DNP, AGNP-C Nurse Practitioner  [facility] [address], [address], [place], [place] [address] P: [phone]    F: [phone]  01/25/2024, 10:48 AM

## 2024-01-25 NOTE — Progress Notes (Signed)
 Physical Therapy Treatment Patient Details Name: Frank Zuniga MRN: [id number] DOB: June 11, 1940 Today's Date: 01/25/2024   History of Present Illness Pt is an 83 y.o. male who presented 01/20/24 with progressive weakness and multiple falls. Pt is s/p L4-L5, L5-S1 PLIF 01/08/24. Pt found to have infected lumbar wound, s/p debridement 11/1. PMHx: AS, prostate CA, GERD, HTN    PT Comments  Pt greeted supine in bed, pleasant and agreeable to PT session. He recalled 3/3 back precautions and log roll technique. Pt required minimal cues for sequencing. He completed sit<>stand from bed using RW with modA x2. Pt maintained static stance for ~30 seconds before fatigue set in and bilateral knees buckled. Attempted to recover with maxA x2, but pt was unable so facilitated safe return to bed. Retrieved stedy in order to transfer to recliner chair. Pt required modA x2 to power up into stance. Patient will benefit from continued inpatient follow up therapy, <3 hours/day    If plan is discharge home, recommend the following: Assistance with cooking/housework;Assist for transportation;Two people to help with walking and/or transfers;Two people to help with bathing/dressing/bathroom;Help with stairs or ramp for entrance   Can travel by private vehicle     No  Equipment Recommendations  Deitra lift;BSC/3in1;Wheelchair (measurements PT);Wheelchair cushion (measurements PT)    Recommendations for Other Services       Precautions / Restrictions Precautions Precautions: Back;Fall Precaution Booklet Issued: No Recall of Precautions/Restrictions: Intact Precaution/Restrictions Comments: Wound Vac at back; No brace needed Restrictions Weight Bearing Restrictions Per Provider Order: No     Mobility  Bed Mobility Overal bed mobility: Needs Assistance Bed Mobility: Rolling, Sidelying to Sit Rolling: Used rails, Min assist Sidelying to sit: HOB elevated, Used rails, Mod assist, +2 for physical assistance        General bed mobility comments: Pt recalled log roll technique. Cues for sequencing. Pt bent L knee and helped push himself into sidelying. Assist at trunk/pelvis. Pt brought BLE off EOB. +2 assist to elevate trunk.    Transfers Overall transfer level: Needs assistance Equipment used: Rolling walker (2 wheels), Ambulation equipment used Transfers: Sit to/from Stand, Bed to chair/wheelchair/BSC             General transfer comment: Pt stood from lowest bed height. Cued proper hand placement and sequencing. He powered up with modA x2. Pt maintained static stance for ~30sec before bilateral knees buckled, unable to correct with maxA x2 and multi-modal cues. Facilitated return to bed. Brought over stedy and had pt stand with modA x2. Transferred to recliner chair. Fair eccentric control.    Ambulation/Gait               General Gait Details: Unable. Pt quickly fatigued in static stance with bilateral knee buckling   Stairs             Wheelchair Mobility     Tilt Bed    Modified Rankin (Stroke Patients Only)       Balance Overall balance assessment: Needs assistance Sitting-balance support: Bilateral upper extremity supported, Feet supported Sitting balance-Leahy Scale: Fair     Standing balance support: Bilateral upper extremity supported, During functional activity, Reliant on assistive device for balance Standing balance-Leahy Scale: Poor Standing balance comment: Pt dependent on external support and AD vs. lift equipment.                            Communication Communication Communication: Impaired Factors Affecting Communication: Hearing impaired  Cognition Arousal: Alert Behavior During Therapy: WFL for tasks assessed/performed   PT - Cognitive impairments: No family/caregiver present to determine baseline                       PT - Cognition Comments: Pt A,Ox4. He recalled 3/3 back precautions. Following commands:  Impaired Following commands impaired: Follows one step commands with increased time    Cueing Cueing Techniques: Verbal cues, Gestural cues, Tactile cues  Exercises      General Comments General comments (skin integrity, edema, etc.): HR at start of session 130s and with mobility 140s, returned to 120s at end of session.      Pertinent Vitals/Pain Pain Assessment Pain Assessment: Faces Faces Pain Scale: Hurts little more Pain Location: Back and B knees Pain Descriptors / Indicators: Grimacing, Discomfort, Moaning Pain Intervention(s): Monitored during session, Limited activity within patient's tolerance, Repositioned    Home Living                          Prior Function            PT Goals (current goals can now be found in the care plan section) Acute Rehab PT Goals Patient Stated Goal: Have less pain and move better PT Goal Formulation: With patient Time For Goal Achievement: 02/04/24 Potential to Achieve Goals: Good Progress towards PT goals: Progressing toward goals    Frequency    Min 2X/week      PT Plan      Co-evaluation              AM-PAC PT 6 Clicks Mobility   Outcome Measure  Help needed turning from your back to your side while in a flat bed without using bedrails?: A Little Help needed moving from lying on your back to sitting on the side of a flat bed without using bedrails?: Total Help needed moving to and from a bed to a chair (including a wheelchair)?: Total Help needed standing up from a chair using your arms (e.g., wheelchair or bedside chair)?: Total Help needed to walk in hospital room?: Total Help needed climbing 3-5 steps with a railing? : Total 6 Click Score: 8    End of Session Equipment Utilized During Treatment: Gait belt Activity Tolerance: Patient tolerated treatment well;Patient limited by fatigue Patient left: in chair;with call bell/phone within reach;with chair alarm set Nurse Communication: Mobility  status;Need for lift equipment PT Visit Diagnosis: Unsteadiness on feet (R26.81);Other abnormalities of gait and mobility (R26.89);Muscle weakness (generalized) (M62.81);Difficulty in walking, not elsewhere classified (R26.2);History of falling (Z91.81);Pain Pain - part of body:  (Back)     Time: 8594-8570 PT Time Calculation (min) (ACUTE ONLY): 24 min  Charges:    $Therapeutic Activity: 23-37 mins                       Randall SAUNDERS, PT, DPT Acute Rehabilitation Services Office: [phone] Secure Chat Preferred  Frank Zuniga 01/25/2024, 5:04 PM

## 2024-01-25 NOTE — Plan of Care (Signed)

## 2024-01-25 NOTE — TOC Progression Note (Addendum)
 Transition of Care [facility]) - Progression Note    Patient Details  Name: Frank Zuniga MRN: [id number] Date of Birth: 12-31-1940  Transition of Care [facility]) CM/SW Contact  Bridget Cordella Simmonds, LCSW Phone Number: 01/25/2024, 10:20 AM  Clinical Narrative:   [facility] cannot make bed offer.  [facility] can make bed offer.  MD note referencing DC home.  CSW left message with Dr Mavis office regarding home vs SNF and DC date.   1030: CSW spoke with Meghan PA: pt stable for DC to SNF.  CSW spoke with pt son Frank Zuniga, updated him on bed offer only at [facility] rehab for [place] location.  He is agreeable to accept this bed.  CSW spoke with Allison/[facility].  She is still waiting for final approval.  Informed her of need for wound vac.   1050: Pt updated, also in agreement, just got off the phone with his son.  CSW received message from Allison:they are good to accept.  She is checking when they can have wound vac on site.  [facility] updated with facility choice.   1100: Message from [place]: they can have wound vac available today.   1555: TC Connie/[facility]A: SNF auth approved, 7 days: [id number], PTAR approved: [id number].  NP informed.   Expected Discharge Plan: Skilled Nursing Facility Barriers to Discharge: Continued Medical Work up, SNF Pending bed offer               Expected Discharge Plan and Services In-house Referral: Clinical Social Work   Post Acute Care Choice: Skilled Nursing Facility Living arrangements for the past 2 months: Single Family Home                                       Social Drivers of Health (SDOH) Interventions SDOH Screenings   Food Insecurity: No Food Insecurity (01/21/2024)  Housing: Low Risk  (01/21/2024)  Transportation Needs: No Transportation Needs (01/21/2024)  Utilities: Not At Risk (01/21/2024)  Social Connections: Moderately Integrated (01/21/2024)  Tobacco Use: Medium Risk (01/20/2024)    Readmission Risk Interventions     No data to  display

## 2024-01-26 LAB — CULTURE, BLOOD (ROUTINE X 2)
Culture: NO GROWTH
Culture: NO GROWTH

## 2024-01-26 NOTE — Consult Note (Signed)
 Reason for Consult: Open lumbar wound; WOC to manage VAC therapy Wound type: surgical wound Measurement: 7 x 4 x 4 cm, undermining 4 cm circum, varying depth distal end Wound bed: pink, some yellow slough, palpated bone, clean, no malodor Drainage light pink, no bright red drainage or clots noted Periwound: erythema; moisture skin breakdown improved, blistering noted to left flank from drape and moisture build-up, patient had no complaints of pain with dressing removal Dressing procedure/placement/frequency: VAC therapy per provider twice a week upon transfer   Removed old NPWT dressing; carefully removed two foams, images taken, no other black foam noted in cavity, used flashlight to visualize and palpated structure, no additional foam noted by two WOCs Cleansed wound with normal saline x 3 and sterile gauze Patient tolerated VAC foam dressing procedure well and coordinated care with primary nurse and student nurse was also present. Informed this patient is discharging today so VAC was discontinued and will be reapplied at SNF. Patient stated the dressing was comfortable.    Open lumbar surgical site 01/26/24  11/07  Blistering left flank11/07   Open blistered skin applied foam silicone dressing  Sherrilyn Hals MSN RN [facility] WOC [facility]  [phone] (Available from 7-3 pm Mon-Friday)

## 2024-01-26 NOTE — Discharge Summary (Addendum)
 Physician Discharge Summary     Providing Compassionate, Quality Care - Together   Patient ID: Frank Zuniga MRN: [id number] DOB/AGE: 85-Feb-1942 85 y.o.  Admit date: 01/20/2024 Discharge date: 01/26/2024  Admission Diagnoses: Wound infection after surgery  Discharge Diagnoses:  Principal Problem:   Wound infection after surgery   Discharged Condition: good  Hospital Course: Patient underwent I and D of his surgical wound by Dr. Gillie on 01/20/2024. He was admitted to 5N09  following recovery from anesthesia in the PACU. His wound cultures grew Staph aureus and Pseudomonas. He has been started on the appropriate antibiotics and a PICC line has been placed. He has worked with both physical and occupational therapies who feel the patient would benefit from further rehabilitation at a skilled nursing facility. He is tolerating a normal diet. He is not having any bowel or bladder dysfunction. His pain is well-controlled with oral pain medication. He is ready for discharge to [facility].   Consults: ID and rehabilitation medicine  Significant Diagnostic Studies: radiology: ECHOCARDIOGRAM COMPLETE Result Date: 01/24/2024    ECHOCARDIOGRAM REPORT   Patient Name:   Frank Zuniga Date of Exam: 01/24/2024 Medical Rec #:  [id number]    Height:       65.0 in Accession #:    [id number]   Weight:       152.1 lb Date of Birth:  02-Dec-1940    BSA:          1.761 m Patient Age:    83 years     BP:           143/86 mmHg Patient Gender: M            HR:           99 bpm. Exam Location:  Inpatient Procedure: 2D Echo, Cardiac Doppler and Color Doppler (Both Spectral and Color            Flow Doppler were utilized during procedure). Indications:    Endocarditis  History:        Patient has prior history of Echocardiogram examinations, most                 recent 10/05/2023. Hx of cancer, Signs/Symptoms:Murmur and Chest                 Pain; Risk Factors:Former Smoker, Dyslipidemia and Hypertension.  Sonographer:     Koleen Popper RDCS Referring Phys: [phone] STEPHANIE N DIXON IMPRESSIONS  1. Left ventricular ejection fraction, by estimation, is 50 to 55%. The left ventricle has low normal function. The left ventricle has no regional wall motion abnormalities. There is mild concentric left ventricular hypertrophy. Left ventricular diastolic parameters are consistent with Grade I diastolic dysfunction (impaired relaxation).  2. Right ventricular systolic function is normal. The right ventricular size is normal. There is normal pulmonary artery systolic pressure.  3. The mitral valve is normal in structure. No evidence of mitral valve regurgitation. No evidence of mitral stenosis.  4. The aortic valve is calcified. Aortic valve regurgitation is not visualized. Mild aortic valve stenosis. Aortic valve area, by VTI measures 1.36 cm. Aortic valve mean gradient measures 13.0 mmHg. Aortic valve Vmax measures 2.39 m/s.  5. The inferior vena cava is normal in size with greater than 50% respiratory variability, suggesting right atrial pressure of 3 mmHg. FINDINGS  Left Ventricle: Left ventricular ejection fraction, by estimation, is 50 to 55%. The left ventricle has low normal function. The left ventricle has no regional wall  motion abnormalities. The left ventricular internal cavity size was normal in size. There is mild concentric left ventricular hypertrophy. Left ventricular diastolic parameters are consistent with Grade I diastolic dysfunction (impaired relaxation). Normal left ventricular filling pressure. Right Ventricle: The right ventricular size is normal. No increase in right ventricular wall thickness. Right ventricular systolic function is normal. There is normal pulmonary artery systolic pressure. The tricuspid regurgitant velocity is 1.47 m/s, and  with an assumed right atrial pressure of 3 mmHg, the estimated right ventricular systolic pressure is 11.6 mmHg. Left Atrium: Left atrial size was normal in size. Right Atrium:  Right atrial size was normal in size. Pericardium: There is no evidence of pericardial effusion. Mitral Valve: The mitral valve is normal in structure. Mild mitral annular calcification. No evidence of mitral valve regurgitation. No evidence of mitral valve stenosis. Tricuspid Valve: The tricuspid valve is normal in structure. Tricuspid valve regurgitation is trivial. No evidence of tricuspid stenosis. Aortic Valve: The aortic valve is calcified. Aortic valve regurgitation is not visualized. Mild aortic stenosis is present. Aortic valve mean gradient measures 13.0 mmHg. Aortic valve peak gradient measures 22.8 mmHg. Aortic valve area, by VTI measures 1.36 cm. Pulmonic Valve: The pulmonic valve was normal in structure. Pulmonic valve regurgitation is not visualized. No evidence of pulmonic stenosis. Aorta: The aortic root is normal in size and structure. Venous: The inferior vena cava is normal in size with greater than 50% respiratory variability, suggesting right atrial pressure of 3 mmHg. IAS/Shunts: No atrial level shunt detected by color flow Doppler.  LEFT VENTRICLE PLAX 2D LVIDd:         4.00 cm      Diastology LVIDs:         3.20 cm      LV e' medial:    5.22 cm/s LV PW:         1.20 cm      LV E/e' medial:  9.6 LV IVS:        1.20 cm      LV e' lateral:   9.25 cm/s LVOT diam:     1.80 cm      LV E/e' lateral: 5.4 LV SV:         59 LV SV Index:   34 LVOT Area:     2.54 cm  LV Volumes (MOD) LV vol d, MOD A4C: 147.0 ml LV vol s, MOD A4C: 72.7 ml LV SV MOD A4C:     147.0 ml RIGHT VENTRICLE             IVC RV Basal diam:  4.00 cm     IVC diam: 2.00 cm RV S prime:     17.50 cm/s TAPSE (M-mode): 2.4 cm LEFT ATRIUM             Index        RIGHT ATRIUM           Index LA diam:        3.80 cm 2.16 cm/m   RA Area:     11.00 cm LA Vol (A2C):   27.5 ml 15.62 ml/m  RA Volume:   22.30 ml  12.66 ml/m LA Vol (A4C):   21.7 ml 12.32 ml/m LA Biplane Vol: 24.7 ml 14.03 ml/m  AORTIC VALVE AV Area (Vmax):    1.67 cm AV  Area (Vmean):   1.63 cm AV Area (VTI):     1.36 cm AV Vmax:  238.60 cm/s AV Vmean:          167.000 cm/s AV VTI:            0.435 m AV Peak Grad:      22.8 mmHg AV Mean Grad:      13.0 mmHg LVOT Vmax:         157.00 cm/s LVOT Vmean:        107.000 cm/s LVOT VTI:          0.232 m LVOT/AV VTI ratio: 0.53  AORTA Ao Root diam: 3.00 cm Ao Asc diam:  3.20 cm MITRAL VALVE               TRICUSPID VALVE MV Area (PHT): 3.77 cm    TR Peak grad:   8.6 mmHg MV Decel Time: 201 msec    TR Vmax:        147.00 cm/s MV E velocity: 50.30 cm/s MV A velocity: 96.40 cm/s  SHUNTS MV E/A ratio:  0.52        Systemic VTI:  0.23 m                            Systemic Diam: 1.80 cm Annabella Scarce MD Electronically signed by Annabella Scarce MD Signature Date/Time: 01/24/2024/4:40:49 PM    Final    US  EKG SITE RITE Result Date: 01/23/2024 If Site Rite image not attached, placement could not be confirmed due to current cardiac rhythm.  US  EKG SITE RITE Result Date: 01/22/2024 If Site Rite image not attached, placement could not be confirmed due to current cardiac rhythm.    Treatments: surgery: Lumbar wound debridement with placement of Wound VAC  Discharge Exam: Blood pressure 133/60, pulse 89, temperature 98 F (36.7 C), temperature source Oral, resp. rate 18, height 5' 5 (1.651 m), weight 69 kg, SpO2 99%.  Alert and oriented to self, place, and situation Speech clear MAE, generalized weakness Wound VAC in place, serosanguinous drainage in chamber  Disposition: Discharge disposition: 03-Skilled Nursing Facility       Discharge Instructions     Call MD for:  difficulty breathing, headache or visual disturbances   Complete by: As directed    Call MD for:  hives   Complete by: As directed    Call MD for:  persistant nausea and vomiting   Complete by: As directed    Call MD for:  redness, tenderness, or signs of infection (pain, swelling, redness, odor or green/yellow discharge around incision  site)   Complete by: As directed    Call MD for:  severe uncontrolled pain   Complete by: As directed    Diet - low sodium heart healthy   Complete by: As directed    Discharge wound care:   Complete by: As directed    Wound VAC at 125 mmHg   Home infusion instructions   Complete by: As directed    Instructions: Flushing of vascular access device: 0.9% NaCl pre/post medication administration and prn patency; Heparin  100 u/ml, 5ml for implanted ports and Heparin  10u/ml, 5ml for all other central venous catheters.   Increase activity slowly   Complete by: As directed       Allergies as of 01/26/2024       Reactions   Prevnar 13 [pneumococcal 13-val Conj Vacc] Rash   Fever, chills, Pain        Medication List     STOP taking these medications  celecoxib 200 MG capsule Commonly known as: CELEBREX   cephALEXin 500 MG capsule Commonly known as: KEFLEX   cyclobenzaprine 5 MG tablet Commonly known as: FLEXERIL   methotrexate 50 MG/2ML injection       TAKE these medications    aspirin  EC 81 MG tablet Take 81 mg by mouth daily.   ceFEPime IVPB Commonly known as: MAXIPIME Inject 2 g into the vein every 12 (twelve) hours. Indication:  Surgical site infection of spine  First Dose: Yes Last Day of Therapy:  03/02/24  Labs - Once weekly:  CBC/D and BMP, Labs - Once weekly: ESR and CRP   Centrum Silver 50+Men Tabs Take 1 tablet by mouth daily.   cetirizine 10 MG tablet Commonly known as: ZYRTEC Take 10 mg by mouth daily.   chlorhexidine  4 % external liquid Commonly known as: HIBICLENS  Apply 15 mLs (1 Application total) topically as directed for 30 doses. Use as directed daily for 5 days every other week for 6 weeks.   Cholecalciferol 25 MCG (1000 UT) tablet Take 1,000 Units by mouth 2 (two) times daily.   Flomax 0.4 MG Caps capsule Generic drug: tamsulosin Take 0.4 mg by mouth in the morning.   folic acid 1 MG tablet Commonly known as: FOLVITE Take 1 mg  by mouth daily.   hydroxychloroquine 200 MG tablet Commonly known as: PLAQUENIL Take 200 mg by mouth 2 (two) times daily.   hydroxypropyl methylcellulose / hypromellose 2.5 % ophthalmic solution Commonly known as: ISOPTO TEARS / GONIOVISC Place 1 drop into both eyes daily as needed for dry eyes.   Krill Oil 350 MG Caps Take 350 mg by mouth in the morning.   mupirocin ointment 2 % Commonly known as: BACTROBAN Place 1 Application into the nose 2 (two) times daily for 60 doses. Use as directed 2 times daily for 5 days every other week for 6 weeks.   NexIUM 40 MG capsule Generic drug: esomeprazole Take 40 mg by mouth daily before breakfast.   OVER THE COUNTER MEDICATION Take 1 tablet by mouth 2 (two) times daily. Prostate plus/ Urino Zinc   oxyCODONE-acetaminophen  5-325 MG tablet Commonly known as: Percocet Take 1-2 tablets by mouth every 4 (four) hours as needed. What changed:  how much to take when to take this reasons to take this   polyethylene glycol 17 g packet Commonly known as: MiraLax Take 17 g by mouth daily. What changed:  when to take this reasons to take this   predniSONE 5 MG tablet Commonly known as: DELTASONE Take 5 mg by mouth in the morning.   sulfaSALAzine 500 MG tablet Commonly known as: AZULFIDINE Take 1,000 mg by mouth 2 (two) times daily.   Super Calcium 1500 (600 Ca) MG Tabs tablet Generic drug: calcium carbonate Take 600 mg of elemental calcium by mouth 2 (two) times daily with a meal.   Tribenzor 40-5-12.5 MG Tabs Generic drug: Olmesartan-amLODIPine-HCTZ Take 1 tablet by mouth in the morning.   True Vitamin B3 500 MG tablet Generic drug: niacin Take 500 mg by mouth in the morning and at bedtime.               Home Infusion Instuctions  (From admission, onward)           Start     Ordered   01/24/24 0000  Home infusion instructions       Question:  Instructions  Answer:  Flushing of vascular access device: 0.9% NaCl  pre/post medication administration and  prn patency; Heparin  100 u/ml, 5ml for implanted ports and Heparin  10u/ml, 5ml for all other central venous catheters.   01/24/24 0843              Discharge Care Instructions  (From admission, onward)           Start     Ordered   01/26/24 0000  Discharge wound care:       Comments: Wound VAC at 125 mmHg   01/26/24 0808            Follow-up Information     [facility] Follow up on 02/07/2024.   Specialty: Infectious Diseases Why: Hospital Discharge Follow Up with ID pharmacist [facility] 11/19 @ 2:30 pm Contact information: [address] [place], [address] [place]  [address] [phone]        Luiz Channel, MD Follow up on 02/19/2024.   Specialty: Infectious Diseases Why: Hospital Discharge Follow Up with Dr. Luiz for antibiotic management at 10:45 am Contact information: [address] [place] [address] [phone]         Mavis Purchase, MD. Go on 02/02/2024.   Specialty: Neurosurgery Why: Follow up appointment with x-rays is on 02/02/2024 at 8:30 AM. Contact information: [address] [place] [address] [phone]                 Signed: Gerard Beck, DNP, AGNP-C Nurse Practitioner  [facility] [address], [address], [place], [place] [address] P: [phone]    F: [phone]  01/26/2024, 8:12 AM

## 2024-01-26 NOTE — TOC Transition Note (Signed)
 Transition of Care [facility]) - Discharge Note   Patient Details  Name: Frank Zuniga MRN: [id number] Date of Birth: March 19, 1941  Transition of Care [facility]) CM/SW Contact:  Bridget Cordella Simmonds, LCSW Phone Number: 01/26/2024, 11:42 AM   Clinical Narrative:   Pt discharging to [place] rehab, room 213. RN report to [phone].   PTAR called [phone].    Final next level of care: Skilled Nursing Facility Barriers to Discharge: Barriers Resolved   Patient Goals and CMS Choice Patient states their goals for this hospitalization and ongoing recovery are:: walk by myself   Choice offered to / list presented to : Patient, Adult Children (pt and son [name] requesting Librarian, Academic)      Discharge Placement              Patient chooses bed at:  [facility]) Patient to be transferred to facility by: PTAR Name of family member notified: son [name] Patient and family notified of of transfer: 01/26/24  Discharge Plan and Services Additional resources added to the After Visit Summary for   In-house Referral: Clinical Social Work   Post Acute Care Choice: Skilled Nursing Facility                               Social Drivers of Health (SDOH) Interventions SDOH Screenings   Food Insecurity: No Food Insecurity (01/21/2024)  Housing: Low Risk  (01/21/2024)  Transportation Needs: No Transportation Needs (01/21/2024)  Utilities: Not At Risk (01/21/2024)  Social Connections: Moderately Integrated (01/21/2024)  Tobacco Use: Medium Risk (01/20/2024)     Readmission Risk Interventions     No data to display

## 2024-01-26 NOTE — Discharge Instructions (Signed)
 Wound Care Patient will need a Wound VAC reapplied to his surgical wound once transported to the facility.  Activity Walk each and every day, increasing distance each day. No lifting greater than 5 lbs.  Avoid excessive back motion. No driving for 2 weeks; may ride as a passenger locally.  Diet Resume your normal diet.  Call Your Doctor If Any of These Occur Redness, drainage, or swelling at the wound.  Temperature greater than 101 degrees. Severe pain not relieved by pain medication. Incision starts to come apart.  Follow Up Appt Call [phone] today for appointment in 1 week if you don't already have one or for any problems.  If you have any hardware placed in your spine, you will need an x-ray before your appointment.

## 2024-01-26 NOTE — Progress Notes (Signed)
 Pt discharged to [facility] rehab with picc line in place for iv antibiotics administration at the facility. Wound vac removed by WOC nurse and wet to dry dressing applied pre discharge. Report called and given to receiving facility nurse

## 2024-02-07 ENCOUNTER — Other Ambulatory Visit: Payer: Self-pay

## 2024-02-07 ENCOUNTER — Ambulatory Visit (INDEPENDENT_AMBULATORY_CARE_PROVIDER_SITE_OTHER): Admitting: Pharmacist

## 2024-02-07 DIAGNOSIS — T8149XA Infection following a procedure, other surgical site, initial encounter: Secondary | ICD-10-CM

## 2024-02-07 NOTE — Progress Notes (Unsigned)
 HPI: Frank Zuniga is a 83 y.o. male who has a telephone office visit with [facility] clinic today for hospital follow up for surgical site infection of the lumbar spine.  Referring ID Provider: Dr. 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, coronary CT angio 2025 10/24/2023   Heart murmur 09/19/2023   Spinal stenosis of lumbar region 09/18/2023   Foot-drop 09/18/2023   Degeneration of lumbar intervertebral disc 03/02/2021   Gastro-esophageal reflux disease with esophagitis 03/02/2021   Primary generalized (osteo)arthritis 03/02/2021   Hospital discharge follow-up 03/03/2020   Hypokalemia 03/03/2020   S/P lumbar laminectomy 02/17/2020   Facet hypertrophy of lumbar region 09/20/2019   Acute pain of right knee 12/16/2018   Primary osteoarthritis of right hip 12/16/2018   Actinic keratosis 06/26/2018   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   Environmental and seasonal allergies 06/26/2018   Lumbar back pain with radiculopathy affecting lower extremity 06/26/2018   Prostate cancer (HCC) 12/13/2016   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   CALCIUM  CARBONATE (SUPER CALCIUM ) 1500 (600 CA) MG TABS TABLET    Take 600 mg of elemental calcium  by mouth 2 (two) times daily with a meal.   CEFEPIME  (MAXIPIME ) IVPB    Inject 2 g into the vein every 12 (twelve) hours.  Indication:  Surgical site infection of spine  First Dose: Yes Last Day of Therapy:  03/02/24  Labs - Once weekly:  CBC/D and BMP, Labs - Once weekly: ESR and CRP   CETIRIZINE (ZYRTEC) 10 MG TABLET    Take 10 mg by mouth daily.   CHLORHEXIDINE  (HIBICLENS ) 4 % EXTERNAL LIQUID    Apply 15 mLs (1 Application total) topically as directed for 30 doses. Use as directed daily for 5 days every other week for 6 weeks.   CHOLECALCIFEROL  25 MCG (1000 UT) TABLET    Take 1,000 Units by mouth 2 (two) times daily.   ESOMEPRAZOLE (NEXIUM) 40 MG CAPSULE    Take 40 mg by mouth daily before breakfast.   FOLIC ACID  (FOLVITE ) 1 MG TABLET    Take 1 mg by mouth daily.   HYDROXYCHLOROQUINE  (PLAQUENIL ) 200 MG TABLET    Take 200 mg by mouth 2 (two) times daily.   HYDROXYPROPYL METHYLCELLULOSE / HYPROMELLOSE (ISOPTO TEARS / GONIOVISC) 2.5 % OPHTHALMIC SOLUTION    Place 1 drop into both eyes daily as needed for dry eyes.   KRILL OIL 350 MG CAPS    Take 350 mg by mouth in the morning.   MULTIPLE VITAMINS-MINERALS (CENTRUM SILVER 50+MEN) TABS    Take 1 tablet by mouth daily.   MUPIROCIN  OINTMENT (BACTROBAN ) 2 %    Place 1 Application into the nose 2 (two) times daily for 60 doses. Use as directed 2 times daily for 5 days every other week for 6 weeks.   NIACIN  (TRUE VITAMIN B3) 500 MG TABLET  Take 500 mg by mouth in the morning and at bedtime.   OLMESARTAN-AMLODIPINE-HCTZ (TRIBENZOR) 40-5-12.5 MG TABS    Take 1 tablet by mouth in the morning.   OVER THE COUNTER MEDICATION    Take 1 tablet by mouth 2 (two) times daily. Prostate plus/ Urino Zinc   OXYCODONE-ACETAMINOPHEN  (PERCOCET) 5-325 MG TABLET    Take 1-2 tablets by mouth every 4 (four) hours as needed.   POLYETHYLENE GLYCOL (MIRALAX) 17 G PACKET    Take 17 g by mouth daily.   PREDNISONE (DELTASONE) 5 MG TABLET    Take 5 mg by mouth in the morning.   SULFASALAZINE (AZULFIDINE) 500 MG TABLET    Take 1,000 mg by mouth 2 (two) times daily.   TAMSULOSIN (FLOMAX) 0.4 MG CAPS  CAPSULE    Take 0.4 mg by mouth in the morning.  Modified Medications   No medications on file  Discontinued Medications   No medications on file    Assessment: Lindberg was recently admitted in the hospital from 01/20/2024 - 01/26/2024 for a surgical site infection related to the lumbar spine and is status post lumbar debridement (01/20/2024) and lumbar fusion and decompression (01/08/2024). Wound cultures from surgical debridement resulted with MSSA and Pseudomonas aeruginosa. Patient is currently on cefepime IV 2g Q12H with an end of therapy of 03/02/2024 and is currently at a SNF.   Communicated with Norman from [facility] and [facility] today. Patient receiving woundvac (MWF) without any issues or reports of increased drainage, no issues with PICC site, no fevers, no changes in mental status from baseline, and patient reports feeling better this week without any concern for worsened back pain. Noted that Dr. Luiz wanted patient to receive twice weekly BMP labs but Norman reports only having an order for once weekly BMP, in which I have sent a fax for the orders to be updated to twice weekly BMP lab checks. Labs that were collected from 02/05/2024 and faxed to clinic are within normal limits (renal function and WBC are okay). Informed Norman and patient that there is a scheduled onsite appointment visit with Dr. Luiz on 02/23/2024.   Plan: - Continue cefepime IV 2g Q12H  - Monitor clinical progression and BMP twice weekly (updated orders faxed to [phone]) - Follow up with Dr. Luiz on 02/23/2024   Feliciano Close, PharmD PGY2 Infectious Diseases Pharmacy Resident

## 2024-02-19 ENCOUNTER — Ambulatory Visit: Admitting: Internal Medicine

## 2024-02-23 ENCOUNTER — Other Ambulatory Visit: Payer: Self-pay

## 2024-02-23 ENCOUNTER — Telehealth: Payer: Self-pay

## 2024-02-23 ENCOUNTER — Telehealth: Admitting: Internal Medicine

## 2024-02-23 DIAGNOSIS — B965 Pseudomonas (aeruginosa) (mallei) (pseudomallei) as the cause of diseases classified elsewhere: Secondary | ICD-10-CM | POA: Diagnosis not present

## 2024-02-23 DIAGNOSIS — T8149XA Infection following a procedure, other surgical site, initial encounter: Secondary | ICD-10-CM | POA: Diagnosis not present

## 2024-02-23 DIAGNOSIS — A4901 Methicillin susceptible Staphylococcus aureus infection, unspecified site: Secondary | ICD-10-CM

## 2024-02-23 DIAGNOSIS — B9561 Methicillin susceptible Staphylococcus aureus infection as the cause of diseases classified elsewhere: Secondary | ICD-10-CM

## 2024-02-23 DIAGNOSIS — A498 Other bacterial infections of unspecified site: Secondary | ICD-10-CM

## 2024-02-23 NOTE — Progress Notes (Signed)
 Virtual Visit via Video Note  I connected with Frank Zuniga on 02/23/24 at 10:30 AM EST by a video enabled telemedicine application and verified that I am speaking with the correct person using two identifiers.  Location: Patient: at [facility] Provider: at clinic   I discussed the limitations of evaluation and management by telemedicine and the availability of in person appointments. The patient expressed understanding and agreed to proceed.  History of Present Illness:   83yo M with history of Lumbar fusion on 01/08/24 who presented with early wound drainage, thought initially to be superficial infection vs.  Slow wound healing. He was then admitted on 11/1 for ground level fall but at this time found to have purulent drainage from incision < 2 wk of original surgery. His I x D OR cultures grew MSSA and PsA for which he was dishcarged on cefepime  through 03/02/24. His nurse reports that incision is slowly healing. Inflammatory markers are still elevated per my review Observations/Objective: Gen = a x o by 3 in nad, sitting up in bed Does not appear in any distress  4.5 x 2.5 x 0.5 cm -- still needing wound vac- end date 12/13 currently--- 12.1 - 31 sed rate and crp reactve -- cr .66/ bun24.5 gfr 90   Assessment and Plan: Lumbar fusion Surgical site infection, polymicrobial = will extend cefepime  through 12/27, reassess at that time to transition to orals. Asked for SNF to continue with weekly labs, and picc line maintenance  Follow Up Instructions:see above    I discussed the assessment and treatment plan with the patient. The patient was provided an opportunity to ask questions and all were answered. The patient agreed with the plan and demonstrated an understanding of the instructions.   The patient was advised to call back or seek an in-person evaluation if the symptoms worsen or if the condition fails to improve as anticipated. I personally spent a total of 10 minutes in the care of  the patient today including preparing to see the patient, placing orders, documenting clinical information in the EHR, independently interpreting results, communicating results, and coordinating care.   Montie Bologna, MD      Fax: [phone]--extend abtx to fax: cefepime  2gm twice a day Push abx for additional 2 wks--03/16/24--- PT

## 2024-02-23 NOTE — Telephone Encounter (Signed)
 Per Dr. Luiz going to extend picc linetill 12/27 with weekly labs. Faxed orders over to [facility] and received confirmation.

## 2024-03-12 ENCOUNTER — Other Ambulatory Visit: Payer: Self-pay

## 2024-03-12 ENCOUNTER — Encounter: Payer: Self-pay | Admitting: Infectious Diseases

## 2024-03-12 ENCOUNTER — Telehealth: Payer: Self-pay

## 2024-03-12 ENCOUNTER — Ambulatory Visit (INDEPENDENT_AMBULATORY_CARE_PROVIDER_SITE_OTHER): Admitting: Infectious Diseases

## 2024-03-12 VITALS — BP 122/79 | HR 112 | Temp 97.6°F

## 2024-03-12 DIAGNOSIS — A498 Other bacterial infections of unspecified site: Secondary | ICD-10-CM

## 2024-03-12 DIAGNOSIS — Z5181 Encounter for therapeutic drug level monitoring: Secondary | ICD-10-CM

## 2024-03-12 DIAGNOSIS — M0579 Rheumatoid arthritis with rheumatoid factor of multiple sites without organ or systems involvement: Secondary | ICD-10-CM | POA: Diagnosis not present

## 2024-03-12 DIAGNOSIS — A4901 Methicillin susceptible Staphylococcus aureus infection, unspecified site: Secondary | ICD-10-CM | POA: Diagnosis not present

## 2024-03-12 DIAGNOSIS — S31000A Unspecified open wound of lower back and pelvis without penetration into retroperitoneum, initial encounter: Secondary | ICD-10-CM

## 2024-03-12 DIAGNOSIS — M4626 Osteomyelitis of vertebra, lumbar region: Secondary | ICD-10-CM | POA: Insufficient documentation

## 2024-03-12 NOTE — Progress Notes (Addendum)
 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, coronary CT angio 2025 10/24/2023   Heart murmur 09/19/2023   Spinal stenosis of lumbar region 09/18/2023   Foot-drop 09/18/2023   Degeneration of lumbar intervertebral disc 03/02/2021   Gastro-esophageal reflux disease with esophagitis 03/02/2021   Primary generalized (osteo)arthritis 03/02/2021   Hospital discharge follow-up 03/03/2020   Hypokalemia 03/03/2020   S/P lumbar laminectomy 02/17/2020   Facet hypertrophy of lumbar region 09/20/2019   Acute pain of right knee 12/16/2018   Primary osteoarthritis of right hip 12/16/2018   Actinic keratosis 06/26/2018   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   Environmental and seasonal allergies 06/26/2018   Lumbar back pain with radiculopathy affecting lower extremity 06/26/2018   Prostate cancer (HCC) 12/13/2016   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   CALCIUM  CARBONATE (SUPER CALCIUM ) 1500 (600 CA) MG TABS TABLET    Take 600 mg of elemental calcium  by mouth 2 (two) times daily with a meal.   CETIRIZINE (ZYRTEC) 10 MG TABLET    Take 10 mg by mouth daily.   CHLORHEXIDINE  (HIBICLENS ) 4 % EXTERNAL LIQUID    Apply 15 mLs (1 Application total) topically as directed for 30 doses. Use as directed daily for 5 days every other week for 6 weeks.   CHOLECALCIFEROL  25 MCG (1000 UT)  TABLET    Take 1,000 Units by mouth 2 (two) times daily.   ESOMEPRAZOLE (NEXIUM) 40 MG CAPSULE    Take 40 mg by mouth daily before breakfast.   FOLIC ACID  (FOLVITE ) 1 MG TABLET    Take 1 mg by mouth daily.   HYDROXYCHLOROQUINE  (PLAQUENIL ) 200 MG TABLET    Take 200 mg by mouth 2 (two) times daily.   HYDROXYPROPYL METHYLCELLULOSE / HYPROMELLOSE (ISOPTO TEARS / GONIOVISC) 2.5 % OPHTHALMIC SOLUTION    Place 1 drop into both eyes daily as needed for dry eyes.   KRILL OIL 350 MG CAPS    Take 350 mg by mouth in the morning.   MULTIPLE VITAMINS-MINERALS (CENTRUM SILVER 50+MEN) TABS    Take 1 tablet by mouth daily.   NIACIN  (TRUE VITAMIN B3) 500 MG TABLET    Take 500 mg by mouth in the morning and at bedtime.   OLMESARTAN -AMLODIPINE -HCTZ (TRIBENZOR) 40-5-12.5 MG TABS    Take 1 tablet by mouth in the morning.   OVER THE COUNTER MEDICATION    Take 1 tablet by mouth 2 (two) times daily. Prostate plus/ Urino Zinc    OXYCODONE -ACETAMINOPHEN  (PERCOCET) 5-325 MG TABLET    Take 1-2 tablets by mouth every 4 (four) hours as needed.   POLYETHYLENE GLYCOL (MIRALAX ) 17 G PACKET    Take 17 g by mouth daily.   PREDNISONE  (DELTASONE ) 5 MG TABLET    Take 5 mg by mouth in the morning.   SULFASALAZINE  (AZULFIDINE ) 500 MG TABLET    Take 1,000 mg by mouth 2 (two) times  daily.   TAMSULOSIN  (FLOMAX ) 0.4 MG CAPS CAPSULE    Take 0.4 mg by mouth in the morning.  Modified Medications   No medications on file  Discontinued Medications   No medications on file    Subjective: 83 Y O male with prior  h/o as below including GERD, HTN, Prostate ca, with history of redo bilateral L4-L5 and right L5-S1 instrumented  fusion on 01/08/2024 then found to have drainage on 10/26 where in the ED felt it was thought to have part of serous drainage.  Started to have worsening lower extremity weakness including ground-level fall in 11/1 leading to admission 11/1-11/7 followed by I&D on 11/1 with note of purulence during OR. OR culture MSSA, PsA.  Discharged on 11/7 to complete IV cefepime  through 03/02/24  Interval events Seen by Dr Luiz (note not complete yet) and IV cefepime  was extended through 12/27.   12/23 Came from SNF. MAR reviewed and confirmed on cefepime . See MAR below. He uses wheel chair mostly but ambulatory with cane at times. Does not know when he saw Dr Arlen last or when has upcoming appt. Denies any concerns with PICC or antibiotics. Denies fevers, chills, nausea, vomiting or diarrhea.   On asa 81 mg po daily  Cholecalciferol  5000 U daily  Duloxetine 30 mg po daily  Plaquenil  200mg  po daily  Flomax  Folic acid   Loratidine  MV Nexium Oxycodone  PEG  Tylenol    Review of Systems: All systems reviewed with pertinent positive and negative as listed above  Past Medical History:  Diagnosis Date   Arthritis    Cancer (HCC)    prostate   Family history of adverse reaction to anesthesia    Died with surgery; chest surgery   GERD (gastroesophageal reflux disease)    Hypertension    Past Surgical History:  Procedure Laterality Date   BACK SURGERY     HERNIA REPAIR     x 3   LUMBAR WOUND DEBRIDEMENT N/A 01/20/2024   Procedure: LUMBAR WOUND DEBRIDEMENT;  Surgeon: Gillie Duncans, MD;  Location: [facility] OR;  Service: Neurosurgery;  Laterality: N/A;  Lumbar Wound Exploration, Placement of Wound Vac   PROSTATE SURGERY     RIGHT/LEFT HEART CATH AND CORONARY ANGIOGRAPHY N/A 10/26/2023   Procedure: RIGHT/LEFT HEART CATH AND CORONARY ANGIOGRAPHY;  Surgeon: Darron Deatrice LABOR, MD;  Location: [facility] INVASIVE CV LAB;  Service: Cardiovascular;  Laterality: N/A;   TONSILLECTOMY      Social History[1]  No family history on file.  Allergies[2]  Health Maintenance  Topic Date Due   COVID-19 Vaccine (8 - 2025-26 season) 11/20/2023   Zoster Vaccines- Shingrix (2 of 2) 01/30/2024   Medicare Annual Wellness (AWV)  02/29/2024   DTaP/Tdap/Td (2 - Td or Tdap) 09/02/2027   Pneumococcal Vaccine: 50+ Years  Completed   Influenza  Vaccine  Completed   Meningococcal B Vaccine  Aged Out    Objective: BP 122/79   Pulse (!) 112   Temp 97.6 F (36.4 C) (Temporal)   SpO2 96%    Physical Exam Constitutional:      Appearance: Normal appearance.  HENT:     Head: Normocephalic and atraumatic.      Mouth: Mucous membranes are moist.  Eyes:    Conjunctiva/sclera: Conjunctivae normal.     Pupils: Pupils are equal, round, and b/l symmetrical    Cardiovascular:     Rate and Rhythm: mildly tachycardic     Heart sounds:   Pulmonary:     Effort: Pulmonary effort is  normal.     Breath sounds: Normal breath sounds.   Abdominal:     General: Non distended     Palpations: soft.   Musculoskeletal:        General: sitting in a wheel chair, posterior lumbar wound with a wound vac, intact and no leak or surrounding cellulitis   Skin:    General: Skin is warm and dry.     Comments: PICC okay with no signs of infection   Neurological:     General:    Mental Status: awake, alert and oriented  Psychiatric:        Mood and Affect: Mood normal.   Lab Results Lab Results  Component Value Date   WBC 8.6 01/21/2024   HGB 10.2 (L) 01/21/2024   HCT 30.2 (L) 01/21/2024   MCV 99.0 01/21/2024   PLT 240 01/21/2024    Lab Results  Component Value Date   CREATININE 0.85 01/21/2024   BUN 16 01/20/2024   NA 133 (L) 01/20/2024   K 3.5 01/20/2024   CL 95 (L) 01/20/2024   CO2 22 01/20/2024   No results found for: ALT, AST, GGT, ALKPHOS, BILITOT  No results found for: CHOL, HDL, LDLCALC, LDLDIRECT, TRIG, CHOLHDL No results found for: LABRPR, RPRTITER No results found for: HIV1RNAQUANT, HIV1RNAVL, CD4TABS   Microbiology Results for orders placed or performed during the hospital encounter of 01/20/24  Aerobic/Anaerobic Culture w Gram Stain (surgical/deep wound)     Status: None   Collection Time: 01/20/24  8:07 PM   Specimen: Wound  Result Value Ref Range Status   Specimen Description  WOUND  Final   Special Requests NONE  Final   Gram Stain   Final    FEW WBC PRESENT, PREDOMINANTLY PMN FEW GRAM POSITIVE COCCI    Culture   Final    FEW STAPHYLOCOCCUS AUREUS RARE PSEUDOMONAS AERUGINOSA NO ANAEROBES ISOLATED Performed at [facility], [address], [place], [place] [address]    Report Status 01/25/2024 FINAL  Final   Organism ID, Bacteria STAPHYLOCOCCUS AUREUS  Final   Organism ID, Bacteria PSEUDOMONAS AERUGINOSA  Final      Susceptibility   Pseudomonas aeruginosa - MIC*    MEROPENEM <=0.25 SENSITIVE Sensitive     CIPROFLOXACIN <=0.06 SENSITIVE Sensitive     IMIPENEM 2 SENSITIVE Sensitive     PIP/TAZO Value in next row Sensitive      <=4 SENSITIVEThis is a modified FDA-approved test that has been validated and its performance characteristics determined by the reporting laboratory.  This laboratory is certified under the Clinical Laboratory Improvement Amendments CLIA as qualified to perform high complexity clinical laboratory testing.    CEFEPIME  Value in next row Sensitive      <=4 SENSITIVEThis is a modified FDA-approved test that has been validated and its performance characteristics determined by the reporting laboratory.  This laboratory is certified under the Clinical Laboratory Improvement Amendments CLIA as qualified to perform high complexity clinical laboratory testing.    CEFTAZIDIME/AVIBACTAM Value in next row Sensitive      <=4 SENSITIVEThis is a modified FDA-approved test that has been validated and its performance characteristics determined by the reporting laboratory.  This laboratory is certified under the Clinical Laboratory Improvement Amendments CLIA as qualified to perform high complexity clinical laboratory testing.    CEFTOLOZANE/TAZOBACTAM Value in next row Sensitive      <=4 SENSITIVEThis is a modified FDA-approved test that has been validated and its performance characteristics determined by the reporting laboratory.  This laboratory is  certified under the Clinical Laboratory Improvement Amendments CLIA as qualified to perform high complexity clinical laboratory testing.    TOBRAMYCIN Value in next row Sensitive      <=4 SENSITIVEThis is a modified FDA-approved test that has been validated and its performance characteristics determined by the reporting laboratory.  This laboratory is certified under the Clinical Laboratory Improvement Amendments CLIA as qualified to perform high complexity clinical laboratory testing.    CEFTAZIDIME Value in next row Sensitive      <=4 SENSITIVEThis is a modified FDA-approved test that has been validated and its performance characteristics determined by the reporting laboratory.  This laboratory is certified under the Clinical Laboratory Improvement Amendments CLIA as qualified to perform high complexity clinical laboratory testing.    * RARE PSEUDOMONAS AERUGINOSA   Staphylococcus aureus - MIC*    CIPROFLOXACIN Value in next row Sensitive      <=4 SENSITIVEThis is a modified FDA-approved test that has been validated and its performance characteristics determined by the reporting laboratory.  This laboratory is certified under the Clinical Laboratory Improvement Amendments CLIA as qualified to perform high complexity clinical laboratory testing.    ERYTHROMYCIN Value in next row Sensitive      <=4 SENSITIVEThis is a modified FDA-approved test that has been validated and its performance characteristics determined by the reporting laboratory.  This laboratory is certified under the Clinical Laboratory Improvement Amendments CLIA as qualified to perform high complexity clinical laboratory testing.    GENTAMICIN Value in next row Sensitive      <=4 SENSITIVEThis is a modified FDA-approved test that has been validated and its performance characteristics determined by the reporting laboratory.  This laboratory is certified under the Clinical Laboratory Improvement Amendments CLIA as qualified to perform high  complexity clinical laboratory testing.    OXACILLIN Value in next row Sensitive      <=4 SENSITIVEThis is a modified FDA-approved test that has been validated and its performance characteristics determined by the reporting laboratory.  This laboratory is certified under the Clinical Laboratory Improvement Amendments CLIA as qualified to perform high complexity clinical laboratory testing.    TETRACYCLINE Value in next row Sensitive      <=4 SENSITIVEThis is a modified FDA-approved test that has been validated and its performance characteristics determined by the reporting laboratory.  This laboratory is certified under the Clinical Laboratory Improvement Amendments CLIA as qualified to perform high complexity clinical laboratory testing.    VANCOMYCIN  Value in next row Sensitive      <=4 SENSITIVEThis is a modified FDA-approved test that has been validated and its performance characteristics determined by the reporting laboratory.  This laboratory is certified under the Clinical Laboratory Improvement Amendments CLIA as qualified to perform high complexity clinical laboratory testing.    TRIMETH/SULFA Value in next row Sensitive      <=4 SENSITIVEThis is a modified FDA-approved test that has been validated and its performance characteristics determined by the reporting laboratory.  This laboratory is certified under the Clinical Laboratory Improvement Amendments CLIA as qualified to perform high complexity clinical laboratory testing.    CLINDAMYCIN Value in next row Sensitive      <=4 SENSITIVEThis is a modified FDA-approved test that has been validated and its performance characteristics determined by the reporting laboratory.  This laboratory is certified under the Clinical Laboratory Improvement Amendments CLIA as qualified to perform high complexity clinical laboratory testing.    RIFAMPIN Value in next row Sensitive      <=4  SENSITIVEThis is a modified FDA-approved test that has been validated and  its performance characteristics determined by the reporting laboratory.  This laboratory is certified under the Clinical Laboratory Improvement Amendments CLIA as qualified to perform high complexity clinical laboratory testing.    Inducible Clindamycin Value in next row Sensitive      <=4 SENSITIVEThis is a modified FDA-approved test that has been validated and its performance characteristics determined by the reporting laboratory.  This laboratory is certified under the Clinical Laboratory Improvement Amendments CLIA as qualified to perform high complexity clinical laboratory testing.    LINEZOLID Value in next row Sensitive      <=4 SENSITIVEThis is a modified FDA-approved test that has been validated and its performance characteristics determined by the reporting laboratory.  This laboratory is certified under the Clinical Laboratory Improvement Amendments CLIA as qualified to perform high complexity clinical laboratory testing.    * FEW STAPHYLOCOCCUS AUREUS  Culture, blood (Routine X 2) w Reflex to ID Panel     Status: None   Collection Time: 01/21/24  3:13 PM   Specimen: BLOOD  Result Value Ref Range Status   Specimen Description BLOOD RIGHT ANTECUBITAL  Final   Special Requests   Final    BOTTLES DRAWN AEROBIC AND ANAEROBIC Blood Culture results may not be optimal due to an inadequate volume of blood received in culture bottles   Culture   Final    NO GROWTH 5 DAYS Performed at [facility], [address] [place], [place] [address]    Report Status 01/26/2024 FINAL  Final  Culture, blood (Routine X 2) w Reflex to ID Panel     Status: None   Collection Time: 01/21/24  3:20 PM   Specimen: BLOOD  Result Value Ref Range Status   Specimen Description BLOOD LEFT ANTECUBITAL  Final   Special Requests   Final    BOTTLES DRAWN AEROBIC AND ANAEROBIC Blood Culture results may not be optimal due to an inadequate volume of blood received in culture bottles   Culture   Final    NO GROWTH  5 DAYS Performed at [facility], [address] [place], [place] [address]    Report Status 01/26/2024 FINAL  Final   Imaging No results found.   Assessment/Plan # Post op deep lumbar surgical site infection complicated with hardware  - I&D on 11/1 with note of purulence during OR. OR culture MSSA, PsA.  Per OR note, immediate purulent effluent from the wound with opening of the thoracolumbar fascia, pedicle screws on both sides had good purchase in the bone. Discharged on 11/7 to complete IV cefepime  through 03/02/24 - 12/5 clinic visit with Dr Luiz, appears to have IV cefepime  extended through 12/27  Plan - continue IV cefepime  through 12/27 then start ciprofloxacin 750mg  po bid and cefadroxil 1g po bid thereafter, expect long duration. 11/1 qtc 432 - Pull PICC after last dose on 12/27 - No OPAT labs sent, will do labs in office - fu in 3 weeks  - fu with neurosurgery for post op wound care, still has wound vac  # RA  - on plaquenil , low risk for immunosuppression   I personally spent a total of 33 minutes in the care of the patient today including reviewing discharge note 11/7, OR note 11/1, Dr. Junior notes preparing to see the patient, getting/reviewing separately obtained history, performing a medically appropriate exam/evaluation, counseling and educating, placing orders, documenting clinical information in the EHR, independently interpreting results,  and communicating results.   Of note, portions of this note may have been created with voice recognition software. While this note has been edited for accuracy, occasional wrong-word or sound-a-like substitutions may have occurred due to the inherent limitations of voice recognition software.   Annalee Joseph, MD [facility] 03/12/2024, 9:48 AM     [1]  Social History Tobacco Use   Smoking status: Former    Current packs/day: 0.00    Types: Cigarettes     Quit date: 1974    Years since quitting: 52.0   Smokeless tobacco: Never  Vaping Use   Vaping status: Never Used  Substance Use Topics   Alcohol use: Never   Drug use: Never  [2]  Allergies Allergen Reactions   Prevnar 13 [Pneumococcal 13-Val Conj Vacc] Rash    Fever, chills, Pain   "

## 2024-03-12 NOTE — Telephone Encounter (Signed)
 Per Dr. Dea end date to pull picc after last dose is on 12/27 and start oral abx after picc is pulled. Sent written orders back to [facility].

## 2024-03-13 LAB — CBC
HCT: 42.2 % (ref 39.4–51.1)
Hemoglobin: 13.9 g/dL (ref 13.2–17.1)
MCH: 29.9 pg (ref 27.0–33.0)
MCHC: 32.9 g/dL (ref 31.6–35.4)
MCV: 90.8 fL (ref 81.4–101.7)
MPV: 11.4 fL (ref 7.5–12.5)
Platelets: 247 Thousand/uL (ref 140–400)
RBC: 4.65 Million/uL (ref 4.20–5.80)
RDW: 12.9 % (ref 11.0–15.0)
WBC: 8.6 Thousand/uL (ref 3.8–10.8)

## 2024-03-13 LAB — COMPREHENSIVE METABOLIC PANEL WITH GFR
AG Ratio: 0.9 (calc) — ABNORMAL LOW (ref 1.0–2.5)
ALT: 14 U/L (ref 9–46)
AST: 17 U/L (ref 10–35)
Albumin: 3.1 g/dL — ABNORMAL LOW (ref 3.6–5.1)
Alkaline phosphatase (APISO): 58 U/L (ref 35–144)
BUN/Creatinine Ratio: 29 (calc) — ABNORMAL HIGH (ref 6–22)
BUN: 26 mg/dL — ABNORMAL HIGH (ref 7–25)
CO2: 26 mmol/L (ref 20–32)
Calcium: 10.7 mg/dL — ABNORMAL HIGH (ref 8.6–10.3)
Chloride: 103 mmol/L (ref 98–110)
Creat: 0.91 mg/dL (ref 0.70–1.22)
Globulin: 3.5 g/dL (ref 1.9–3.7)
Glucose, Bld: 122 mg/dL — ABNORMAL HIGH (ref 65–99)
Potassium: 3.9 mmol/L (ref 3.5–5.3)
Sodium: 138 mmol/L (ref 135–146)
Total Bilirubin: 0.6 mg/dL (ref 0.2–1.2)
Total Protein: 6.6 g/dL (ref 6.1–8.1)
eGFR: 83 mL/min/1.73m2

## 2024-03-13 LAB — SEDIMENTATION RATE: Sed Rate: 43 mm/h — ABNORMAL HIGH (ref 0–20)

## 2024-03-13 LAB — C-REACTIVE PROTEIN: CRP: 70 mg/L — ABNORMAL HIGH

## 2024-03-15 ENCOUNTER — Ambulatory Visit: Payer: Self-pay | Admitting: Infectious Diseases

## 2024-03-20 ENCOUNTER — Telehealth: Payer: Self-pay | Admitting: Pharmacist

## 2024-03-20 NOTE — Telephone Encounter (Signed)
 Ok sounds good they are checking twice weekly, so we will wait for updated labs from this Friday. Thank you!

## 2024-03-20 NOTE — Telephone Encounter (Signed)
 Received faxed labs from [facility] showing Scr jumped from baseline ~0.9 to 1.54 on 12/30. Patient completed IV antibiotics on 12/27 and started cipro 750mg  BID and cefadroxil 1g BID. Based on current Scr, CrCl is estimated to be ~34 ml/min which requires decreased dosing. Updated dosing would be cefadroxil 500mg  BID and cipro 500mg  BID. Would you like them to repeat Scr before dose adjustment?  Thank you,   Alan Geralds, PharmD, CPP, BCIDP, AAHIVP Clinical Pharmacist Practitioner Infectious Diseases Clinical Pharmacist [facility]

## 2024-03-22 ENCOUNTER — Telehealth: Payer: Self-pay

## 2024-03-22 NOTE — Telephone Encounter (Signed)
 Norman, RN at [facility] called to inform provider - patient has  been having increased weakness. In house MD decreased ciprofloxacin to 250 MG BID.   Aynsley Fleet SHAUNNA Letters, CMA

## 2024-03-26 NOTE — Telephone Encounter (Signed)
 Meant to forward this to you last week!

## 2024-03-28 NOTE — Telephone Encounter (Addendum)
 Called [facility] this morning and spoke with nurse Norman to confirm patient's Scr had improved back to 1.18 on 1/2. Nurse stated that patient required Lasix which led to Scr change, but it improved after he received some fluids. CrCl now back to ~45 ml/min still slightly higher than baseline. Cefadroxil 1g twice daily still appropriately dosed.  Norman states MD had initially prescribed cipro at 500mg  twice daily but then decreased to 250mg  twice daily due to kidney function. I reviewed that patient's culture grew Pseudomonas indicating need for cipro dose of 750mg  twice daily. Technically, with CrCl slightly less than 50 ml/min, would recommend ciprofloxacin 500mg  twice daily. Will fax orders to [facility] [phone])  for appropriate dosing.  They asked if you wanted labs still forwarded to our office even with him off of IV antibiotics. Let me know!     Alan Geralds, PharmD, CPP, BCIDP, AAHIVP Clinical Pharmacist Practitioner Infectious Diseases Clinical Pharmacist [facility]

## 2024-03-29 NOTE — Telephone Encounter (Signed)
 Sent fax instructions to please forward labs to our clinic before 1/14 appointment.  Alan Geralds, PharmD, CPP, BCIDP, AAHIVP Clinical Pharmacist Practitioner Infectious Diseases Clinical Pharmacist [facility]

## 2024-04-03 ENCOUNTER — Ambulatory Visit: Payer: Self-pay | Admitting: Internal Medicine

## 2024-04-03 ENCOUNTER — Encounter: Payer: Self-pay | Admitting: Internal Medicine

## 2024-04-03 ENCOUNTER — Other Ambulatory Visit: Payer: Self-pay

## 2024-04-03 VITALS — BP 147/79 | HR 87 | Temp 98.0°F

## 2024-04-03 DIAGNOSIS — A498 Other bacterial infections of unspecified site: Secondary | ICD-10-CM

## 2024-04-03 DIAGNOSIS — Z5181 Encounter for therapeutic drug level monitoring: Secondary | ICD-10-CM | POA: Diagnosis not present

## 2024-04-03 DIAGNOSIS — A4901 Methicillin susceptible Staphylococcus aureus infection, unspecified site: Secondary | ICD-10-CM

## 2024-04-03 DIAGNOSIS — T8130XD Disruption of wound, unspecified, subsequent encounter: Secondary | ICD-10-CM | POA: Diagnosis not present

## 2024-04-03 DIAGNOSIS — M4326 Fusion of spine, lumbar region: Secondary | ICD-10-CM

## 2024-04-03 DIAGNOSIS — S31000A Unspecified open wound of lower back and pelvis without penetration into retroperitoneum, initial encounter: Secondary | ICD-10-CM

## 2024-04-03 NOTE — Progress Notes (Signed)
 "      Patient ID: Frank Zuniga, male   DOB: 1940-12-22, 84 y.o.   MRN: [id number]  HPI  84 yo M with history of lumbar fusion on 01/08/24 with early wound drainage that started on 11/2 within 2 wk of original surgery. He had I x D OR cultures grew MSSA and PsA for which he was dishcarged on cefepime  through 03/02/24. His nurse reports that incision is slowly healing. Inflammatory markers are still elevated per my review.  Most recent notes show that his measured wound - 4.5 x 2.5 x 0.5 cm -- still needing wound vac-  12/1 - 31 sed rate and crp reactve -- cr .66/ bun24.5 gfr 90   Was on iv abtx through 12/27, now on cefadroxil 1gm bid plus cipro 500mg  po bid.   Outpatient Encounter Medications as of 04/03/2024  Medication Sig   aspirin  81 MG EC tablet Take 81 mg by mouth daily.   cetirizine (ZYRTEC) 10 MG tablet Take 10 mg by mouth daily.   Cholecalciferol  25 MCG (1000 UT) tablet Take 1,000 Units by mouth 2 (two) times daily.   DULoxetine (CYMBALTA) 20 MG capsule Take 1 capsule by mouth.   esomeprazole (NEXIUM) 40 MG capsule Take 40 mg by mouth daily before breakfast.   folic acid  (FOLVITE ) 1 MG tablet Take 1 mg by mouth daily.   hydroxychloroquine  (PLAQUENIL ) 200 MG tablet Take 200 mg by mouth 2 (two) times daily.   hydroxypropyl methylcellulose / hypromellose (ISOPTO TEARS / GONIOVISC) 2.5 % ophthalmic solution Place 1 drop into both eyes daily as needed for dry eyes.   Krill Oil 350 MG CAPS Take 350 mg by mouth in the morning.   Multiple Vitamins-Minerals (CENTRUM SILVER 50+MEN) TABS Take 1 tablet by mouth daily.   oxyCODONE -acetaminophen  (PERCOCET) 5-325 MG tablet Take 1-2 tablets by mouth every 4 (four) hours as needed.   potassium chloride  SA (KLOR-CON  M) 20 MEQ tablet Take 20 mEq by mouth 2 (two) times daily. (Patient taking differently: Take 20 mEq by mouth daily.)   predniSONE  (DELTASONE ) 5 MG tablet Take 5 mg by mouth in the morning.   tamsulosin  (FLOMAX ) 0.4 MG CAPS capsule Take  0.4 mg by mouth in the morning.   calcium  carbonate (SUPER CALCIUM ) 1500 (600 Ca) MG TABS tablet Take 600 mg of elemental calcium  by mouth 2 (two) times daily with a meal. (Patient not taking: Reported on 04/03/2024)   chlorhexidine  (HIBICLENS ) 4 % external liquid Apply 15 mLs (1 Application total) topically as directed for 30 doses. Use as directed daily for 5 days every other week for 6 weeks. (Patient not taking: Reported on 04/03/2024)   niacin  (TRUE VITAMIN B3) 500 MG tablet Take 500 mg by mouth in the morning and at bedtime. (Patient not taking: Reported on 04/03/2024)   Olmesartan -amLODIPine -HCTZ (TRIBENZOR) 40-5-12.5 MG TABS Take 1 tablet by mouth in the morning. (Patient not taking: Reported on 04/03/2024)   OVER THE COUNTER MEDICATION Take 1 tablet by mouth 2 (two) times daily. Prostate plus/ Urino Zinc  (Patient not taking: Reported on 04/03/2024)   polyethylene glycol (MIRALAX ) 17 g packet Take 17 g by mouth daily. (Patient taking differently: Take 17 g by mouth daily as needed for mild constipation, moderate constipation or severe constipation.)   sulfaSALAzine  (AZULFIDINE ) 500 MG tablet Take 1,000 mg by mouth 2 (two) times daily. (Patient not taking: Reported on 04/03/2024)   No facility-administered encounter medications on file as of 04/03/2024.     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, coronary CT angio 2025 10/24/2023   Heart murmur 09/19/2023   Spinal stenosis of lumbar region 09/18/2023   Foot-drop  09/18/2023   Degeneration of lumbar intervertebral disc 03/02/2021   Gastro-esophageal reflux disease with esophagitis 03/02/2021   Primary generalized (osteo)arthritis 03/02/2021   Hospital discharge follow-up 03/03/2020   Hypokalemia 03/03/2020   S/P lumbar laminectomy 02/17/2020   Facet hypertrophy of lumbar region 09/20/2019   Acute pain of right knee 12/16/2018   Primary osteoarthritis of right hip 12/16/2018   Actinic keratosis 06/26/2018   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   Environmental and seasonal allergies 06/26/2018   Lumbar back pain with radiculopathy affecting lower extremity 06/26/2018   Prostate cancer (HCC) 12/13/2016   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 He appears well-developed and well-nourished. No distress.  HENT:  Mouth/Throat: Oropharynx is clear and moist. No oropharyngeal exudate.  Cardiovascular: Normal rate, regular rhythm and normal heart sounds. Exam reveals no gallop and no friction rub.  No murmur heard.  Pulmonary/Chest: Effort normal and breath sounds normal. No respiratory distress. He has no wheezes.  Neurological: He is alert and oriented to person, place, and time.  Skin: Skin is warm and dry. No rash noted. No erythema.  Psychiatric: He has a normal mood and affect. His behavior  is normal.   CBC Lab Results  Component Value Date   WBC 8.6 03/12/2024   RBC 4.65 03/12/2024   HGB 13.9 03/12/2024   HCT 42.2 03/12/2024   PLT 247 03/12/2024   MCV 90.8 03/12/2024   MCH 29.9 03/12/2024   MCHC 32.9 03/12/2024   RDW 12.9 03/12/2024    BMET Lab Results  Component Value Date   NA 138 03/12/2024   K 3.9 03/12/2024   CL 103 03/12/2024   CO2 26 03/12/2024   GLUCOSE 122 (H) 03/12/2024   BUN 26 (H) 03/12/2024   CREATININE 0.91 03/12/2024   CALCIUM  10.7 (H) 03/12/2024   GFRNONAA >60 01/21/2024    Lab Results  Component Value Date   ESRSEDRATE 25 (H) 04/03/2024   Lab Results  Component Value Date   CRP 5.6 04/03/2024     Assessment and Plan Lumbar fusion complicated by early SSI, wound dehiscence s/p I x D = Will check labs to see he is responding to abtx treatment Has wound vac in place Continue  on cefadroxil and cipro for now  Long term medication management = will check crp and sed rate and bmp    "

## 2024-04-04 LAB — CBC WITH DIFFERENTIAL/PLATELET
Absolute Lymphocytes: 1349 {cells}/uL (ref 850–3900)
Absolute Monocytes: 855 {cells}/uL (ref 200–950)
Basophils Absolute: 67 {cells}/uL (ref 0–200)
Basophils Relative: 0.7 %
Eosinophils Absolute: 0 {cells}/uL — ABNORMAL LOW (ref 15–500)
Eosinophils Relative: 0 %
HCT: 39.7 % (ref 39.4–51.1)
Hemoglobin: 12.7 g/dL — ABNORMAL LOW (ref 13.2–17.1)
MCH: 28.3 pg (ref 27.0–33.0)
MCHC: 32 g/dL (ref 31.6–35.4)
MCV: 88.4 fL (ref 81.4–101.7)
MPV: 11.3 fL (ref 7.5–12.5)
Monocytes Relative: 9 %
Neutro Abs: 7230 {cells}/uL (ref 1500–7800)
Neutrophils Relative %: 76.1 %
Platelets: 247 Thousand/uL (ref 140–400)
RBC: 4.49 Million/uL (ref 4.20–5.80)
RDW: 13.1 % (ref 11.0–15.0)
Total Lymphocyte: 14.2 %
WBC: 9.5 Thousand/uL (ref 3.8–10.8)

## 2024-04-04 LAB — BASIC METABOLIC PANEL WITH GFR
BUN/Creatinine Ratio: 32 (calc) — ABNORMAL HIGH (ref 6–22)
BUN: 30 mg/dL — ABNORMAL HIGH (ref 7–25)
CO2: 28 mmol/L (ref 20–32)
Calcium: 10 mg/dL (ref 8.6–10.3)
Chloride: 104 mmol/L (ref 98–110)
Creat: 0.93 mg/dL (ref 0.70–1.22)
Glucose, Bld: 111 mg/dL — ABNORMAL HIGH (ref 65–99)
Potassium: 4.2 mmol/L (ref 3.5–5.3)
Sodium: 139 mmol/L (ref 135–146)
eGFR: 80 mL/min/1.73m2

## 2024-04-04 LAB — C-REACTIVE PROTEIN: CRP: 5.6 mg/L

## 2024-04-04 LAB — SEDIMENTATION RATE: Sed Rate: 25 mm/h — ABNORMAL HIGH (ref 0–20)

## 2024-04-24 ENCOUNTER — Other Ambulatory Visit: Payer: Self-pay

## 2024-04-24 ENCOUNTER — Encounter: Payer: Self-pay | Admitting: Internal Medicine

## 2024-04-24 ENCOUNTER — Ambulatory Visit: Admitting: Internal Medicine

## 2024-04-24 VITALS — BP 111/72 | HR 123 | Temp 97.6°F

## 2024-04-24 DIAGNOSIS — S31000A Unspecified open wound of lower back and pelvis without penetration into retroperitoneum, initial encounter: Secondary | ICD-10-CM

## 2024-04-24 DIAGNOSIS — A498 Other bacterial infections of unspecified site: Secondary | ICD-10-CM

## 2024-04-24 DIAGNOSIS — A4901 Methicillin susceptible Staphylococcus aureus infection, unspecified site: Secondary | ICD-10-CM

## 2024-04-24 NOTE — Progress Notes (Unsigned)
 Called Dr. Mavis' office with [facility] and confirmed patient has appointment with them on 2/24 at 10:45 AM. Appointment information sent back to [facility] with patient.   Milano Rosevear, BSN, RN

## 2024-04-24 NOTE — Progress Notes (Unsigned)
 "      Patient ID: Frank Zuniga, male   DOB: 08-Aug-1940, 84 y.o.   MRN: [id number]  HPI  Lumbar fusion Skilled nursing home saying that he is not making process, and insurance not paying  Still wearing wound vac. Outpatient Encounter Medications as of 04/24/2024  Medication Sig   aspirin  81 MG EC tablet Take 81 mg by mouth daily.   Cholecalciferol  25 MCG (1000 UT) tablet Take 1,000 Units by mouth 2 (two) times daily.   calcium  carbonate (SUPER CALCIUM ) 1500 (600 Ca) MG TABS tablet Take 600 mg of elemental calcium  by mouth 2 (two) times daily with a meal. (Patient not taking: Reported on 04/03/2024)   cetirizine (ZYRTEC) 10 MG tablet Take 10 mg by mouth daily.   chlorhexidine  (HIBICLENS ) 4 % external liquid Apply 15 mLs (1 Application total) topically as directed for 30 doses. Use as directed daily for 5 days every other week for 6 weeks. (Patient not taking: Reported on 04/03/2024)   DULoxetine (CYMBALTA) 20 MG capsule Take 1 capsule by mouth.   esomeprazole (NEXIUM) 40 MG capsule Take 40 mg by mouth daily before breakfast.   folic acid  (FOLVITE ) 1 MG tablet Take 1 mg by mouth daily.   hydroxychloroquine  (PLAQUENIL ) 200 MG tablet Take 200 mg by mouth 2 (two) times daily.   hydroxypropyl methylcellulose / hypromellose (ISOPTO TEARS / GONIOVISC) 2.5 % ophthalmic solution Place 1 drop into both eyes daily as needed for dry eyes.   Krill Oil 350 MG CAPS Take 350 mg by mouth in the morning.   Multiple Vitamins-Minerals (CENTRUM SILVER 50+MEN) TABS Take 1 tablet by mouth daily.   niacin  (TRUE VITAMIN B3) 500 MG tablet Take 500 mg by mouth in the morning and at bedtime. (Patient not taking: Reported on 04/03/2024)   Olmesartan -amLODIPine -HCTZ (TRIBENZOR) 40-5-12.5 MG TABS Take 1 tablet by mouth in the morning. (Patient not taking: Reported on 04/03/2024)   OVER THE COUNTER MEDICATION Take 1 tablet by mouth 2 (two) times daily. Prostate plus/ Urino Zinc  (Patient not taking: Reported on 04/03/2024)    oxyCODONE -acetaminophen  (PERCOCET) 5-325 MG tablet Take 1-2 tablets by mouth every 4 (four) hours as needed.   polyethylene glycol (MIRALAX ) 17 g packet Take 17 g by mouth daily. (Patient taking differently: Take 17 g by mouth daily as needed for mild constipation, moderate constipation or severe constipation.)   potassium chloride  SA (KLOR-CON  M) 20 MEQ tablet Take 20 mEq by mouth 2 (two) times daily. (Patient taking differently: Take 20 mEq by mouth daily.)   predniSONE  (DELTASONE ) 5 MG tablet Take 5 mg by mouth in the morning.   sulfaSALAzine  (AZULFIDINE ) 500 MG tablet Take 1,000 mg by mouth 2 (two) times daily. (Patient not taking: Reported on 04/03/2024)   tamsulosin  (FLOMAX ) 0.4 MG CAPS capsule Take 0.4 mg by mouth in the morning.   No facility-administered encounter medications on file as of 04/24/2024.     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, coronary CT angio 2025 10/24/2023   Heart murmur 09/19/2023   Spinal stenosis of lumbar region  09/18/2023   Foot-drop 09/18/2023   Degeneration of lumbar intervertebral disc 03/02/2021   Gastro-esophageal reflux disease with esophagitis 03/02/2021   Primary generalized (osteo)arthritis 03/02/2021   Hospital discharge follow-up 03/03/2020   Hypokalemia 03/03/2020   S/P lumbar laminectomy 02/17/2020   Facet hypertrophy of lumbar region 09/20/2019   Acute pain of right knee 12/16/2018   Primary osteoarthritis of right hip 12/16/2018   Actinic keratosis 06/26/2018    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   Environmental and seasonal allergies 06/26/2018   Lumbar back pain with radiculopathy affecting lower extremity 06/26/2018   Prostate cancer (HCC) 12/13/2016   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/13/2016   Essential hypertension 08/30/2016   Preop cardiovascular exam 08/30/2016   Conductive hearing loss in left ear 05/06/2015   Dysfunction of left eustachian tube 05/06/2015     Health Maintenance Due  Topic Date Due   Medicare Annual Wellness (AWV)  07/13/2022   COVID-19 Vaccine (8 - 2025-26 season) 11/20/2023   Zoster Vaccines- Shingrix (2 of 2) 01/30/2024     Review of Systems  Physical Exam   BP 111/72   Pulse (!) 123   Temp 97.6 F (36.4 C) (Temporal)   SpO2 96%    No results found for: CD4TCELL No results found for: CD4TABS No results found for: HIV1RNAQUANT No results found for: HEPBSAB No results found for: RPR, LABRPR  CBC Lab Results  Component Value Date   WBC 9.5 04/03/2024   RBC 4.49 04/03/2024   HGB 12.7 (L) 04/03/2024   HCT 39.7 04/03/2024   PLT 247 04/03/2024   MCV 88.4 04/03/2024   MCH 28.3 04/03/2024   MCHC 32.0 04/03/2024   RDW 13.1 04/03/2024   EOSABS 0 (L) 04/03/2024    BMET Lab Results  Component Value Date   NA 139 04/03/2024   K 4.2 04/03/2024   CL 104 04/03/2024   CO2 28 04/03/2024   GLUCOSE 111 (H) 04/03/2024   BUN 30 (H) 04/03/2024   CREATININE 0.93 04/03/2024   CALCIUM  10.0 04/03/2024   GFRNONAA >60 01/21/2024      Assessment and Plan  Lab work today. Will decide if need to extend abtx. Call in abtx to [facility] Will need to try to see dr mavis feb 24th Continue with PT reassessment ------------------ Cipro 500mg  x 2; cefadroxil 1000mg  po bid x 2 addn weeks communicated to SNF   "

## 2024-04-25 LAB — CBC WITH DIFFERENTIAL/PLATELET
Absolute Lymphocytes: 1168 {cells}/uL (ref 850–3900)
Absolute Monocytes: 1099 {cells}/uL — ABNORMAL HIGH (ref 200–950)
Basophils Absolute: 69 {cells}/uL (ref 0–200)
Basophils Relative: 0.7 %
Eosinophils Absolute: 0 {cells}/uL — ABNORMAL LOW (ref 15–500)
Eosinophils Relative: 0 %
HCT: 39.2 % — ABNORMAL LOW (ref 39.4–51.1)
Hemoglobin: 12.6 g/dL — ABNORMAL LOW (ref 13.2–17.1)
MCH: 27.6 pg (ref 27.0–33.0)
MCHC: 32.1 g/dL (ref 31.6–35.4)
MCV: 86 fL (ref 81.4–101.7)
MPV: 11.3 fL (ref 7.5–12.5)
Monocytes Relative: 11.1 %
Neutro Abs: 7564 {cells}/uL (ref 1500–7800)
Neutrophils Relative %: 76.4 %
Platelets: 210 10*3/uL (ref 140–400)
RBC: 4.56 Million/uL (ref 4.20–5.80)
RDW: 13.7 % (ref 11.0–15.0)
Total Lymphocyte: 11.8 %
WBC: 9.9 10*3/uL (ref 3.8–10.8)

## 2024-04-25 LAB — C-REACTIVE PROTEIN: CRP: 6.4 mg/L

## 2024-04-25 LAB — SEDIMENTATION RATE: Sed Rate: 28 mm/h — ABNORMAL HIGH (ref 0–20)
